# Patient Record
Sex: Male | Born: 1985
Health system: Southern US, Community
[De-identification: ages and names within clinical notes are randomized; demographics above are authoritative.]

## PROBLEM LIST (undated history)

## (undated) DIAGNOSIS — F329 Major depressive disorder, single episode, unspecified: Secondary | ICD-10-CM

## (undated) DIAGNOSIS — F32A Depression, unspecified: Secondary | ICD-10-CM

## (undated) DIAGNOSIS — F419 Anxiety disorder, unspecified: Secondary | ICD-10-CM

## (undated) DIAGNOSIS — I10 Essential (primary) hypertension: Secondary | ICD-10-CM

## (undated) HISTORY — PX: WISDOM TOOTH EXTRACTION: SHX21

---
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
## 2016-07-10 ENCOUNTER — Other Ambulatory Visit: Payer: Self-pay | Admitting: Physician Assistant

## 2016-07-10 DIAGNOSIS — N5089 Other specified disorders of the male genital organs: Secondary | ICD-10-CM

## 2016-07-16 ENCOUNTER — Other Ambulatory Visit: Payer: Self-pay

## 2016-07-18 ENCOUNTER — Ambulatory Visit
Admission: RE | Admit: 2016-07-18 | Discharge: 2016-07-18 | Disposition: A | Discharge status: Home / Self Care | Payer: Self-pay | Source: Ambulatory Visit | Attending: Physician Assistant | Admitting: Physician Assistant

## 2016-07-18 ENCOUNTER — Ambulatory Visit
Admission: RE | Admit: 2016-07-18 | Discharge: 2016-07-18 | Disposition: A | Discharge status: Home / Self Care | Payer: BLUE CROSS/BLUE SHIELD | Source: Ambulatory Visit | Attending: Physician Assistant | Admitting: Physician Assistant

## 2016-07-18 DIAGNOSIS — N5089 Other specified disorders of the male genital organs: Secondary | ICD-10-CM

## 2016-12-05 DIAGNOSIS — M545 Low back pain: Secondary | ICD-10-CM | POA: Diagnosis not present

## 2016-12-05 DIAGNOSIS — M9903 Segmental and somatic dysfunction of lumbar region: Secondary | ICD-10-CM | POA: Diagnosis not present

## 2016-12-05 DIAGNOSIS — M9904 Segmental and somatic dysfunction of sacral region: Secondary | ICD-10-CM | POA: Diagnosis not present

## 2016-12-05 DIAGNOSIS — M9902 Segmental and somatic dysfunction of thoracic region: Secondary | ICD-10-CM | POA: Diagnosis not present

## 2016-12-10 DIAGNOSIS — M545 Low back pain: Secondary | ICD-10-CM | POA: Diagnosis not present

## 2016-12-10 DIAGNOSIS — M9903 Segmental and somatic dysfunction of lumbar region: Secondary | ICD-10-CM | POA: Diagnosis not present

## 2016-12-10 DIAGNOSIS — M9904 Segmental and somatic dysfunction of sacral region: Secondary | ICD-10-CM | POA: Diagnosis not present

## 2016-12-10 DIAGNOSIS — M9902 Segmental and somatic dysfunction of thoracic region: Secondary | ICD-10-CM | POA: Diagnosis not present

## 2016-12-16 DIAGNOSIS — M545 Low back pain: Secondary | ICD-10-CM | POA: Diagnosis not present

## 2016-12-16 DIAGNOSIS — M9902 Segmental and somatic dysfunction of thoracic region: Secondary | ICD-10-CM | POA: Diagnosis not present

## 2016-12-16 DIAGNOSIS — M9904 Segmental and somatic dysfunction of sacral region: Secondary | ICD-10-CM | POA: Diagnosis not present

## 2016-12-16 DIAGNOSIS — M9903 Segmental and somatic dysfunction of lumbar region: Secondary | ICD-10-CM | POA: Diagnosis not present

## 2016-12-26 DIAGNOSIS — M545 Low back pain: Secondary | ICD-10-CM | POA: Diagnosis not present

## 2016-12-26 DIAGNOSIS — M9902 Segmental and somatic dysfunction of thoracic region: Secondary | ICD-10-CM | POA: Diagnosis not present

## 2016-12-26 DIAGNOSIS — M9904 Segmental and somatic dysfunction of sacral region: Secondary | ICD-10-CM | POA: Diagnosis not present

## 2016-12-26 DIAGNOSIS — M9903 Segmental and somatic dysfunction of lumbar region: Secondary | ICD-10-CM | POA: Diagnosis not present

## 2017-01-07 DIAGNOSIS — M9904 Segmental and somatic dysfunction of sacral region: Secondary | ICD-10-CM | POA: Diagnosis not present

## 2017-01-07 DIAGNOSIS — M9903 Segmental and somatic dysfunction of lumbar region: Secondary | ICD-10-CM | POA: Diagnosis not present

## 2017-01-07 DIAGNOSIS — M545 Low back pain: Secondary | ICD-10-CM | POA: Diagnosis not present

## 2017-01-07 DIAGNOSIS — M9902 Segmental and somatic dysfunction of thoracic region: Secondary | ICD-10-CM | POA: Diagnosis not present

## 2017-06-26 DIAGNOSIS — Z0001 Encounter for general adult medical examination with abnormal findings: Secondary | ICD-10-CM | POA: Diagnosis not present

## 2017-06-26 DIAGNOSIS — R7303 Prediabetes: Secondary | ICD-10-CM | POA: Diagnosis not present

## 2017-06-26 DIAGNOSIS — I10 Essential (primary) hypertension: Secondary | ICD-10-CM | POA: Diagnosis not present

## 2017-06-27 DIAGNOSIS — R7303 Prediabetes: Secondary | ICD-10-CM | POA: Diagnosis not present

## 2017-06-27 DIAGNOSIS — Z0001 Encounter for general adult medical examination with abnormal findings: Secondary | ICD-10-CM | POA: Diagnosis not present

## 2017-07-07 DIAGNOSIS — F4322 Adjustment disorder with anxiety: Secondary | ICD-10-CM | POA: Diagnosis not present

## 2017-08-04 DIAGNOSIS — F4322 Adjustment disorder with anxiety: Secondary | ICD-10-CM | POA: Diagnosis not present

## 2017-08-24 ENCOUNTER — Ambulatory Visit (HOSPITAL_COMMUNITY)
Admission: EM | Admit: 2017-08-24 | Discharge: 2017-08-24 | Disposition: A | Discharge status: Home / Self Care | Payer: BLUE CROSS/BLUE SHIELD | Attending: Physician Assistant | Admitting: Physician Assistant

## 2017-08-24 ENCOUNTER — Other Ambulatory Visit: Payer: Self-pay

## 2017-08-24 DIAGNOSIS — J069 Acute upper respiratory infection, unspecified: Secondary | ICD-10-CM

## 2017-08-24 DIAGNOSIS — I10 Essential (primary) hypertension: Secondary | ICD-10-CM

## 2017-08-24 DIAGNOSIS — B9789 Other viral agents as the cause of diseases classified elsewhere: Secondary | ICD-10-CM

## 2017-08-24 MED ORDER — CHLORTHALIDONE 25 MG PO TABS: 12.5000 mg | ORAL_TABLET | Freq: Every day | ORAL | 0 refills | Status: AC

## 2017-08-24 MED ORDER — HYDROCODONE-HOMATROPINE 5-1.5 MG/5ML PO SYRP: 2.5000 mL | ORAL_SOLUTION | Freq: Every day | ORAL | 0 refills | Status: AC

## 2017-08-24 NOTE — ED Provider Notes (Addendum)
08/24/2017 2:16 PM   DOB: [birth year] / MRN: [id number]  SUBJECTIVE:  Tyler Cross is a 32 y.o. male presenting for cough, sore throat, nasal congestion, low-grade fever.  Symptoms have been present now for about 5 days and he thinks is getting worse.  He is tried NyQuil with some mild relief.  Tells me he has had 3 hypertension in the past and his last physical was in February of this year.  He tells me "I need to come to terms with my weight and my blood pressure."  He has some chronic leg swelling.  He denies orthopnea.  He has No Known Allergies.   He  has no past medical history on file.    He   He  has no sexual activity history on file. The patient  has no past surgical history on file.  His family history is not on file.  ROS per HPI  OBJECTIVE:  BP (!) 160/115 (BP Location: Left Arm)   Pulse (!) 115   Temp 99.9 F (37.7 C) (Oral)   SpO2 99%   Wt Readings from Last 3 Encounters:  No data found for Wt   Temp Readings from Last 3 Encounters:  08/24/17 99.9 F (37.7 C) (Oral)   BP Readings from Last 3 Encounters:  08/24/17 (!) 160/115   Pulse Readings from Last 3 Encounters:  08/24/17 (!) 115    Physical Exam  Constitutional: He is oriented to person, place, and time.  Cardiovascular: Normal rate, regular rhythm, normal heart sounds and intact distal pulses. Exam reveals no gallop and no friction rub.  No murmur heard. 96 bpm on auscultation  Pulmonary/Chest: Effort normal and breath sounds normal. No stridor. No respiratory distress. He has no wheezes. He has no rales. He exhibits no tenderness.  Musculoskeletal: He exhibits edema (bilateral, painless).  Neurological: He is oriented to person, place, and time. He is not disoriented. He displays no atrophy, no tremor and normal reflexes. No cranial nerve deficit or sensory deficit. He exhibits normal muscle tone. He displays a negative Romberg sign. He displays no seizure activity. Coordination and gait normal. GCS  eye subscore is 4. GCS verbal subscore is 5. GCS motor subscore is 6.  Heal to toe, finger to nose, gait intact to challenge.     No results found for this or any previous visit (from the past 72 hour(s)).  No results found.  ASSESSMENT AND PLAN:   Uncontrolled hypertension: I will lower his blood pressure slowly with a medium dose of chlorthalidone.  He will follow-up with his primary care in the next couple of days.  Normal neurological exam here.  No chest pain or orthopnea.  Viral URI with cough: Cough syrup.  Given the above problem I have advised that he avoid NSAIDs.  He can take Tylenol 1000 mg every 8 hours as needed.    Discharge Instructions     take Tylenol 1000 mg every 8 hours as needed.  Please start the blood pressure medicine today and try not to miss doses.  I would like you to see your primary care provider in the next few days if possible.        The patient is advised to call or return to clinic if he does not see an improvement in symptoms, or to seek the care of the closest emergency department if he worsens with the above plan.   Deliah Boston, MHS, PA-C 08/24/2017 2:16 PM   Ofilia Neas, PA-C 08/24/17 1415  Ofilia Neas, PA-C 08/24/17 1417

## 2017-08-24 NOTE — ED Triage Notes (Signed)
Coughing, sore throat, dizziness, headaches, started Thursday night

## 2017-08-24 NOTE — Discharge Instructions (Addendum)
take Tylenol 1000 mg every 8 hours as needed.  Please start the blood pressure medicine today and try not to miss doses.  I would like you to see your primary care provider in the next few days if possible.

## 2017-09-04 DIAGNOSIS — I10 Essential (primary) hypertension: Secondary | ICD-10-CM | POA: Diagnosis not present

## 2017-09-04 DIAGNOSIS — R0683 Snoring: Secondary | ICD-10-CM | POA: Diagnosis not present

## 2017-09-04 DIAGNOSIS — R5382 Chronic fatigue, unspecified: Secondary | ICD-10-CM | POA: Diagnosis not present

## 2017-10-19 ENCOUNTER — Other Ambulatory Visit: Payer: Self-pay | Admitting: Physician Assistant

## 2017-10-29 DIAGNOSIS — F331 Major depressive disorder, recurrent, moderate: Secondary | ICD-10-CM | POA: Diagnosis not present

## 2017-11-06 DIAGNOSIS — G4719 Other hypersomnia: Secondary | ICD-10-CM | POA: Diagnosis not present

## 2017-11-18 DIAGNOSIS — F4322 Adjustment disorder with anxiety: Secondary | ICD-10-CM | POA: Diagnosis not present

## 2017-12-09 DIAGNOSIS — F4323 Adjustment disorder with mixed anxiety and depressed mood: Secondary | ICD-10-CM | POA: Diagnosis not present

## 2017-12-10 DIAGNOSIS — F320 Major depressive disorder, single episode, mild: Secondary | ICD-10-CM | POA: Diagnosis not present

## 2018-01-07 DIAGNOSIS — F4323 Adjustment disorder with mixed anxiety and depressed mood: Secondary | ICD-10-CM | POA: Diagnosis not present

## 2018-02-10 DIAGNOSIS — F4323 Adjustment disorder with mixed anxiety and depressed mood: Secondary | ICD-10-CM | POA: Diagnosis not present

## 2018-02-11 DIAGNOSIS — F3342 Major depressive disorder, recurrent, in full remission: Secondary | ICD-10-CM | POA: Diagnosis not present

## 2018-03-03 ENCOUNTER — Encounter (HOSPITAL_COMMUNITY): Payer: Self-pay | Admitting: Emergency Medicine

## 2018-03-03 ENCOUNTER — Ambulatory Visit (HOSPITAL_COMMUNITY)
Admission: EM | Admit: 2018-03-03 | Discharge: 2018-03-03 | Disposition: A | Discharge status: Home / Self Care | Payer: BLUE CROSS/BLUE SHIELD | Attending: Family Medicine | Admitting: Family Medicine

## 2018-03-03 ENCOUNTER — Other Ambulatory Visit: Payer: Self-pay

## 2018-03-03 ENCOUNTER — Ambulatory Visit (INDEPENDENT_AMBULATORY_CARE_PROVIDER_SITE_OTHER): Payer: BLUE CROSS/BLUE SHIELD

## 2018-03-03 DIAGNOSIS — S93422A Sprain of deltoid ligament of left ankle, initial encounter: Secondary | ICD-10-CM

## 2018-03-03 DIAGNOSIS — S99912A Unspecified injury of left ankle, initial encounter: Secondary | ICD-10-CM | POA: Diagnosis not present

## 2018-03-03 DIAGNOSIS — M7989 Other specified soft tissue disorders: Secondary | ICD-10-CM | POA: Diagnosis not present

## 2018-03-03 DIAGNOSIS — M79672 Pain in left foot: Secondary | ICD-10-CM

## 2018-03-03 DIAGNOSIS — S99922A Unspecified injury of left foot, initial encounter: Secondary | ICD-10-CM | POA: Diagnosis not present

## 2018-03-03 DIAGNOSIS — M25572 Pain in left ankle and joints of left foot: Secondary | ICD-10-CM | POA: Diagnosis not present

## 2018-03-03 MED ORDER — NAPROXEN 500 MG PO TABS: 500.0000 mg | ORAL_TABLET | Freq: Two times a day (BID) | ORAL | 0 refills | Status: DC

## 2018-03-03 NOTE — Discharge Instructions (Signed)
Continue conservative management of rest, ice, and elevate Take naproxen as needed for pain relief (may cause abdominal discomfort, ulcers, and GI bleeds avoid taking with other NSAIDs) Follow up with orthopedist if symptoms persist Return or go to the ER if you have any new or worsening symptoms (fever, chills, chest pain, abdominal pain, changes in bowel or bladder habits, pain radiating into lower legs, etc...)

## 2018-03-03 NOTE — ED Provider Notes (Signed)
[facility]   [id number] 03/03/18 Arrival Time: 0809  CC: Left ankle and foot pain  SUBJECTIVE: History from: patient. Tyler Cross is a 32 y.o. male complains of left ankle and foot pain that began 2 days ago.  Began after he rolled his ankle during praise and worship at his church.  Localizes the pain to the inside of ankle and top of foot.  Describes the pain as intermittent.  Has tried ice, elevation, and OTC analgesics with temporary relief.  Symptoms are made worse with weight-bearing and walking.  Reports similar symptoms in the past.  Complains of associated swelling.  Denies fever, chills, erythema, ecchymosis, weakness, numbness and tingling.      ROS: As per HPI.  History reviewed. No pertinent past medical history. History reviewed. No pertinent surgical history. No Known Allergies No current facility-administered medications on file prior to encounter.    Current Outpatient Medications on File Prior to Encounter  Medication Sig Dispense Refill  . buPROPion (WELLBUTRIN) 75 MG tablet Take 75 mg by mouth 2 (two) times daily.    . chlorthalidone (HYGROTON) 25 MG tablet Take 0.5 tablets (12.5 mg total) by mouth daily. 30 tablet 0  . UNKNOWN TO PATIENT      Social History   Socioeconomic History  . Marital status: Married    Spouse name: Not on file  . Number of children: Not on file  . Years of education: Not on file  . Highest education level: Not on file  Occupational History  . Not on file  Social Needs  . Financial resource strain: Not on file  . Food insecurity:    Worry: Not on file    Inability: Not on file  . Transportation needs:    Medical: Not on file    Non-medical: Not on file  Tobacco Use  . Smoking status: Not on file  Substance and Sexual Activity  . Alcohol use: Not on file  . Drug use: Not on file  . Sexual activity: Not on file  Lifestyle  . Physical activity:    Days per week: Not on file    Minutes per session: Not on file    . Stress: Not on file  Relationships  . Social connections:    Talks on phone: Not on file    Gets together: Not on file    Attends religious service: Not on file    Active member of club or organization: Not on file    Attends meetings of clubs or organizations: Not on file    Relationship status: Not on file  . Intimate partner violence:    Fear of current or ex partner: Not on file    Emotionally abused: Not on file    Physically abused: Not on file    Forced sexual activity: Not on file  Other Topics Concern  . Not on file  Social History Narrative  . Not on file   No family history on file.  OBJECTIVE:  Vitals:   03/03/18 0847  BP: 139/80  Pulse: 88  Resp: 18  Temp: 98.6 F (37 C)  TempSrc: Oral  SpO2: 98%    General appearance: AOx3; in no acute distress.  Head: NCAT Lungs: Normal respiratory effort Heart: Left dorsalis pedis pulse 2+ Musculoskeletal: Left ankle/foot  Inspection: Skin warm, dry, clear and intact without obvious erythema, or ecchymosis. Swelling of the dorsal aspect of proximal foot and medial and lateral ankle Palpation: Tender to palpation over medial aspect of  ankle, lateral malleolus and proximal metatarsals  ROM: FROM active and passive with mild discomfort with inversion and eversion Strength: 5/5 dorsiflexion, 5/5 plantar flexion Skin: warm and dry Neurologic: Antalgic gait; Sensation intact about the lower extremities Psychological: alert and cooperative; normal mood and affect  DIAGNOSTIC STUDIES:  Dg Ankle Complete Left  Result Date: 03/03/2018 CLINICAL DATA:  Internal inversion injury of the foot 2 days ago with persistent pain and swelling over the medial malleolus and proximal metatarsals. EXAM: LEFT ANKLE COMPLETE - 3+ VIEW COMPARISON:  Left foot series of today's date FINDINGS: The bones are subjectively adequately mineralized. There is no acute malleolar fracture. The talar dome is intact and the ankle joint mortise is  preserved. The talus and calcaneus exhibit no acute abnormalities. There is an Achilles region calcaneal spur. IMPRESSION: There is no acute or significant chronic bony abnormality of the left ankle. There is diffuse soft tissue swelling. Electronically Signed   By: David  [place] M.D.   On: 03/03/2018 09:23   Dg Foot Complete Left  Result Date: 03/03/2018 CLINICAL DATA:  Patient reports rolling injury internally of the foot 2 days ago with persistent pain and swelling over the medial malleolar region and proximal metatarsals especially when bearing weight. EXAM: LEFT FOOT - COMPLETE 3+ VIEW COMPARISON:  Left ankle series of today's date FINDINGS: There is moderate soft tissue swelling over the midfoot and hindfoot. The phalanges and metatarsals are intact. The tarsal bones exhibit no acute abnormalities. The joint spaces are well maintained. There is an Achilles region calcaneal spur. IMPRESSION: No acute fracture nor dislocation nor significant arthropathic change is observed. There is diffuse soft tissue swelling over the hindfoot and midfoot. Electronically Signed   By: David  [place] M.D.   On: 03/03/2018 09:21    ASSESSMENT & PLAN:  1. Sprain of deltoid ligament of left ankle, initial encounter   2. Left foot pain     Meds ordered this encounter  Medications  . naproxen (NAPROSYN) 500 MG tablet    Sig: Take 1 tablet (500 mg total) by mouth 2 (two) times daily.    Dispense:  30 tablet    Refill:  0    Order Specific Question:   Supervising Provider    Answer:   Eustace Moore [[id number]]   Orders Placed This Encounter  Procedures  . DG Foot Complete Left    Standing Status:   Standing    Number of Occurrences:   1    Order Specific Question:   Reason for Exam (SYMPTOM  OR DIAGNOSIS REQUIRED)    Answer:   foot injury  . DG Ankle Complete Left    Standing Status:   Standing    Number of Occurrences:   1    Order Specific Question:   Reason for Exam (SYMPTOM  OR DIAGNOSIS REQUIRED)      Answer:   foot injury  . Apply cam walker    Standing Status:   Standing    Number of Occurrences:   1    Order Specific Question:   Laterality    Answer:   Left    Cam walker placed Continue conservative management of rest, ice, and elevate Take naproxen as needed for pain relief (may cause abdominal discomfort, ulcers, and GI bleeds avoid taking with other NSAIDs) Follow up with orthopedist if symptoms persist Return or go to the ER if you have any new or worsening symptoms (fever, chills, chest pain, abdominal pain, changes in bowel or bladder  habits, pain radiating into lower legs, etc...)   Reviewed expectations re: course of current medical issues. Questions answered. Outlined signs and symptoms indicating need for more acute intervention. Patient verbalized understanding. After Visit Summary given.    Rennis HardingWurst, [place]onea Gaut, PA-C 03/03/18 1036

## 2018-03-03 NOTE — ED Triage Notes (Signed)
PT believes he injured left ankle Sunday. Area is swollen. PT is able to bear weight, but not full weight

## 2018-05-04 ENCOUNTER — Ambulatory Visit (INDEPENDENT_AMBULATORY_CARE_PROVIDER_SITE_OTHER): Payer: BLUE CROSS/BLUE SHIELD

## 2018-05-04 ENCOUNTER — Other Ambulatory Visit: Payer: Self-pay | Admitting: Podiatrist

## 2018-05-04 ENCOUNTER — Ambulatory Visit (INDEPENDENT_AMBULATORY_CARE_PROVIDER_SITE_OTHER): Payer: BLUE CROSS/BLUE SHIELD | Admitting: Podiatrist

## 2018-05-04 VITALS — BP 137/86 | HR 87

## 2018-05-04 DIAGNOSIS — M79671 Pain in right foot: Secondary | ICD-10-CM | POA: Diagnosis not present

## 2018-05-04 DIAGNOSIS — Q666 Other congenital valgus deformities of feet: Secondary | ICD-10-CM

## 2018-05-04 DIAGNOSIS — M7751 Other enthesopathy of right foot: Secondary | ICD-10-CM | POA: Diagnosis not present

## 2018-05-04 DIAGNOSIS — M7750 Other enthesopathy of unspecified foot: Secondary | ICD-10-CM

## 2018-05-04 DIAGNOSIS — M79672 Pain in left foot: Secondary | ICD-10-CM | POA: Diagnosis not present

## 2018-05-04 DIAGNOSIS — M7752 Other enthesopathy of left foot: Secondary | ICD-10-CM

## 2018-05-04 NOTE — Patient Instructions (Signed)
Orthotics are inserts for your shoes. They will help the way you walk and support your foot structure and alignment.  We take a scan of your foot that the orthotic is specially made to fit.  The insert is prescription just for you.  We will call when the inserts are ready to be dispensed-- it usually takes about 2-3 weeks.  Please bring in the sneakers you wish to wear the orthotics in so we can make sure they fit properly.  Please call if you have not heard from [place].

## 2018-05-05 ENCOUNTER — Encounter: Payer: Self-pay | Admitting: Podiatrist

## 2018-05-05 ENCOUNTER — Other Ambulatory Visit: Payer: Self-pay | Admitting: Podiatrist

## 2018-05-05 ENCOUNTER — Telehealth: Payer: Self-pay | Admitting: Podiatrist

## 2018-05-05 DIAGNOSIS — Q666 Other congenital valgus deformities of feet: Secondary | ICD-10-CM

## 2018-05-05 DIAGNOSIS — M7750 Other enthesopathy of unspecified foot: Secondary | ICD-10-CM

## 2018-05-05 MED ORDER — IBUPROFEN 600 MG PO TABS: 600.0000 mg | ORAL_TABLET | Freq: Three times a day (TID) | ORAL | 2 refills | Status: DC | PRN

## 2018-05-05 MED ORDER — DICLOFENAC SODIUM 75 MG PO TBEC: 75.0000 mg | DELAYED_RELEASE_TABLET | Freq: Two times a day (BID) | ORAL | 1 refills | Status: DC

## 2018-05-05 NOTE — Telephone Encounter (Signed)
Could you send in an order for Ibuprofen 600- take 3 times a day #30 with 2 refills.  I tried to do it but  my provider info isn't set to let me e-prescribe.   Thanks!

## 2018-05-05 NOTE — Addendum Note (Signed)
Addended by: Alphia Kava D on: 05/05/2018 05:07 PM   Modules accepted: Orders

## 2018-05-05 NOTE — Progress Notes (Signed)
  Subjective:     Patient ID: Surafel Bulkley, male   DOB: 13-Feb-1986, 33 y.o.   MRN: [id number]  HPI pain in bilateral feet upon standing   Review of Systems  All other systems reviewed and are negative.      Objective:   Physical Exam GENERAL APPEARANCE: Alert, conversant. Appropriately groomed. No acute distress.  VASCULAR: Pedal pulses palpable at 2/4 DP and PT bilateral.  Capillary refill time is immediate to all digits,  Proximal to distal cooling it warm to warm.  Digital hair growth is present bilateral  Generalized swelling present bilateral feet and lower legs NEUROLOGIC: sensation is intact epicritically and protectively to 5.07 monofilament at 5/5 sites bilateral.  Light touch is intact bilateral, vibratory sensation intact bilateral, achilles tendon reflex is intact bilateral.  MUSCULOSKELETAL: pes plano valgus appearance noted bilaterally.  Discomfort upon compression along the medial arch noted.  General pain on the plantar aspect of bilateral feet noted.  Upon standing - arch flattens and eversion noted of bilateral feet.  Decrease in dorsiflexion at ankle noted bilateral by 10 degrees.   DERMATOLOGIC: skin color, texture, and turger are within normal limits.   Xrays- pes planus foot type is seen.  Lateral view shows sagging at the talonavicular joint, decreased calcaneal inclination angle.  dp view shows some forefoot abduction and uncovering of the Talus at the navicular     Assessment:     Congenital pesplanovalgus with pain and swelling    Plan:    patient is casted for new orthotics.  rx for ibuprofen will also be sent in to his pharmacy, discussed wearing compression stockings for relief of his swelling and to discuss options with his primary care physician regarding the swelling.

## 2018-05-05 NOTE — Telephone Encounter (Signed)
I was there yesterday and would like a call back when you get this message.

## 2018-05-05 NOTE — Telephone Encounter (Addendum)
I called pt and he states he needs some pain medication until he gets his orthotics, not naproxen it doesn't help, but something like ibuprofen or acetaminophen. I told pt I would send the message to Dr. Irving Shows, and often she looks at her messages on out of offices day and may respond today if not then tomorrow. I instructed pt to perform ice therapy 3-4 times daily for 15-20 minutes/session protecting the skin from the ice with a light cloth. Pt states understanding.

## 2018-05-06 ENCOUNTER — Telehealth: Payer: Self-pay | Admitting: *Deleted

## 2018-05-06 NOTE — Telephone Encounter (Signed)
-----   Message from Delories Heinz, DPM sent at 05/05/2018  5:03 PM EST ----- Regarding: I figured it out!!!  you don't need to do rx. I figured out how to do the prescription so it e-prescribes under my provider info.. in the process I also decided to do diclofenac instead of ibuprofen so you don't need to do a rx for him.  THANKS!!  Dr. Bea Laura

## 2018-05-22 ENCOUNTER — Ambulatory Visit: Payer: BLUE CROSS/BLUE SHIELD | Admitting: Orthotics

## 2018-05-22 DIAGNOSIS — Q666 Other congenital valgus deformities of feet: Secondary | ICD-10-CM

## 2018-05-22 DIAGNOSIS — M7750 Other enthesopathy of unspecified foot: Secondary | ICD-10-CM

## 2018-05-22 NOTE — Progress Notes (Signed)
Patient came in today to pick up custom made foot orthotics.  The goals were accomplished and the patient reported no dissatisfaction with said orthotics.  Patient was advised of breakin period and how to report any issues. 

## 2018-06-22 ENCOUNTER — Other Ambulatory Visit: Payer: Self-pay | Admitting: Podiatry

## 2018-07-16 DIAGNOSIS — J3089 Other allergic rhinitis: Secondary | ICD-10-CM | POA: Diagnosis not present

## 2018-07-16 DIAGNOSIS — I10 Essential (primary) hypertension: Secondary | ICD-10-CM | POA: Diagnosis not present

## 2018-07-16 DIAGNOSIS — F3341 Major depressive disorder, recurrent, in partial remission: Secondary | ICD-10-CM | POA: Diagnosis not present

## 2018-08-10 ENCOUNTER — Encounter (HOSPITAL_COMMUNITY): Payer: Self-pay | Admitting: *Deleted

## 2018-08-10 ENCOUNTER — Other Ambulatory Visit: Payer: Self-pay

## 2018-08-10 ENCOUNTER — Ambulatory Visit (HOSPITAL_COMMUNITY)
Admission: EM | Admit: 2018-08-10 | Discharge: 2018-08-10 | Disposition: A | Discharge status: Home / Self Care | Payer: BLUE CROSS/BLUE SHIELD | Attending: Family Medicine | Admitting: Family Medicine

## 2018-08-10 DIAGNOSIS — M25562 Pain in left knee: Secondary | ICD-10-CM | POA: Diagnosis not present

## 2018-08-10 HISTORY — DX: Anxiety disorder, unspecified: F41.9

## 2018-08-10 HISTORY — DX: Essential (primary) hypertension: I10

## 2018-08-10 HISTORY — DX: Depression, unspecified: F32.A

## 2018-08-10 MED ORDER — DICLOFENAC SODIUM 75 MG PO TBEC: 75.0000 mg | DELAYED_RELEASE_TABLET | Freq: Two times a day (BID) | ORAL | 0 refills | Status: DC

## 2018-08-10 NOTE — ED Triage Notes (Signed)
Reports "twisting" left knee few days ago while playing with dog.  C/O difficulty ambulating due to pain and sensation of feeling loose.  Walking with cane; wearing knee brace.

## 2018-08-10 NOTE — ED Provider Notes (Signed)
[facility]   [id number] 08/10/18 Arrival Time: [id number]  ASSESSMENT & PLAN:  1. Acute pain of left knee    No indications for imaging at this time. Discussed.  Meds ordered this encounter  Medications  . diclofenac (VOLTAREN) 75 MG EC tablet    Sig: Take 1 tablet (75 mg total) by mouth 2 (two) times daily.    Dispense:  14 tablet    Refill:  0   Orders Placed This Encounter  Procedures  . Apply knee sleeve   WBAT. Ice to knee.  Follow-up Information    [facility], [facility]   Specialty:  Specialist Why:  Call to schedule follow up if your knee pain is not improving over the next week. Contact information: [address] [place] [id number] [phone]          Reviewed expectations re: course of current medical issues. Questions answered. Outlined signs and symptoms indicating need for more acute intervention. Patient verbalized understanding. After Visit Summary given.  SUBJECTIVE: History from: patient. Geralyn CorwinJoseph Montemurro is a 33 y.o. male who reports fairly persitsent moderate pain of his left lateral knee; described as aching without radiation. Onset: gradual, 3 days ago. Injury/trama: reports "that I twisted my knee while playing with my dog"; able to finish the walk noticed increasing discomfort a few hours later. Symptoms have been mostly unchanged since beginning. Aggravating factors: weight bearing. Alleviating factors: rest. Associated symptoms: none reported. Extremity sensation changes or weakness: none. Self treatment: occasional ibuprofen with mild help History of similar: no.  Past Surgical History:  Procedure Laterality Date  . WISDOM TOOTH EXTRACTION      ROS: As per HPI. All other systems negative.    OBJECTIVE:  Vitals:   08/10/18 0905  BP: 135/84  Pulse: 84  Resp: 16  Temp: 99.1 F (37.3 C)  TempSrc: Oral  SpO2: 100%    General appearance: alert; no distress HEENT: Bruce; AT Neck: supple with FROM Extremities:  . LLE: warm and well perfused; fairly well localized moderate tenderness over left LCL; without gross deformities; with mild swelling; with no bruising; ROM: normal without reported lateral discomfort; without frank instability CV: brisk extremity capillary refill of LLE; 2+ DP and PT pulse of LLE. Skin: warm and dry; no visible rashes Neurologic: normal reflexes of LLE; normal sensation of LLE; normal strength of LLE Psychological: alert and cooperative; normal mood and affect  Allergies  Allergen Reactions  . Kiwi Extract     Past Medical History:  Diagnosis Date  . Anxiety   . Depression   . Hypertension    Social History   Socioeconomic History  . Marital status: Married    Spouse name: Not on file  . Number of children: Not on file  . Years of education: Not on file  . Highest education level: Not on file  Occupational History  . Not on file  Social Needs  . Financial resource strain: Not on file  . Food insecurity:    Worry: Not on file    Inability: Not on file  . Transportation needs:    Medical: Not on file    Non-medical: Not on file  Tobacco Use  . Smoking status: Never Smoker  . Smokeless tobacco: Never Used  Substance and Sexual Activity  . Alcohol use: Yes    Comment: occasionally  . Drug use: Yes    Types: Marijuana  . Sexual activity: Not on file  Lifestyle  . Physical activity:  Days per week: Not on file    Minutes per session: Not on file  . Stress: Not on file  Relationships  . Social connections:    Talks on phone: Not on file    Gets together: Not on file    Attends religious service: Not on file    Active member of club or organization: Not on file    Attends meetings of clubs or organizations: Not on file    Relationship status: Not on file  Other Topics Concern  . Not on file  Social History Narrative  . Not on file   Family History  Problem Relation Age of Onset  . Stroke Mother   . Fibromyalgia Mother   . Healthy Father     Past Surgical History:  Procedure Laterality Date  . WISDOM TOOTH EXTRACTION        Mardella Layman, MD 08/10/18 [id number]

## 2018-10-13 DIAGNOSIS — Z0000 Encounter for general adult medical examination without abnormal findings: Secondary | ICD-10-CM | POA: Diagnosis not present

## 2018-10-13 DIAGNOSIS — Z13220 Encounter for screening for lipoid disorders: Secondary | ICD-10-CM | POA: Diagnosis not present

## 2018-10-13 DIAGNOSIS — R7303 Prediabetes: Secondary | ICD-10-CM | POA: Diagnosis not present

## 2018-10-13 DIAGNOSIS — Z8249 Family history of ischemic heart disease and other diseases of the circulatory system: Secondary | ICD-10-CM | POA: Diagnosis not present

## 2018-10-13 DIAGNOSIS — F3341 Major depressive disorder, recurrent, in partial remission: Secondary | ICD-10-CM | POA: Diagnosis not present

## 2018-10-13 DIAGNOSIS — Z6841 Body Mass Index (BMI) 40.0 and over, adult: Secondary | ICD-10-CM | POA: Diagnosis not present

## 2018-10-16 DIAGNOSIS — F4323 Adjustment disorder with mixed anxiety and depressed mood: Secondary | ICD-10-CM | POA: Diagnosis not present

## 2018-10-17 DIAGNOSIS — F331 Major depressive disorder, recurrent, moderate: Secondary | ICD-10-CM | POA: Diagnosis not present

## 2018-10-26 DIAGNOSIS — F4322 Adjustment disorder with anxiety: Secondary | ICD-10-CM | POA: Diagnosis not present

## 2018-10-28 ENCOUNTER — Emergency Department (HOSPITAL_COMMUNITY): Payer: BC Managed Care – PPO

## 2018-10-28 ENCOUNTER — Encounter (HOSPITAL_COMMUNITY): Payer: Self-pay | Admitting: Emergency Medicine

## 2018-10-28 ENCOUNTER — Other Ambulatory Visit: Payer: Self-pay

## 2018-10-28 ENCOUNTER — Emergency Department (HOSPITAL_COMMUNITY)
Admission: EM | Admit: 2018-10-28 | Discharge: 2018-10-28 | Disposition: A | Discharge status: Home / Self Care | Payer: BC Managed Care – PPO | Attending: Emergency Medicine | Admitting: Emergency Medicine

## 2018-10-28 DIAGNOSIS — I10 Essential (primary) hypertension: Secondary | ICD-10-CM | POA: Diagnosis not present

## 2018-10-28 DIAGNOSIS — J069 Acute upper respiratory infection, unspecified: Secondary | ICD-10-CM | POA: Insufficient documentation

## 2018-10-28 DIAGNOSIS — Z20828 Contact with and (suspected) exposure to other viral communicable diseases: Secondary | ICD-10-CM | POA: Insufficient documentation

## 2018-10-28 DIAGNOSIS — Z79899 Other long term (current) drug therapy: Secondary | ICD-10-CM | POA: Insufficient documentation

## 2018-10-28 DIAGNOSIS — B9789 Other viral agents as the cause of diseases classified elsewhere: Secondary | ICD-10-CM | POA: Diagnosis not present

## 2018-10-28 DIAGNOSIS — R05 Cough: Secondary | ICD-10-CM | POA: Diagnosis not present

## 2018-10-28 LAB — SARS CORONAVIRUS 2 BY RT PCR (HOSPITAL ORDER, PERFORMED IN ~~LOC~~ HOSPITAL LAB): SARS Coronavirus 2: NEGATIVE

## 2018-10-28 NOTE — Discharge Instructions (Signed)
It was our pleasure to provide your ER care today - we hope that you feel better.  Take mucinex or robitussin as need for cough.   Your chest xray looks good/clear and your covid test is negative.   Follow up with primary care doctor in 1 week if symptoms fail to improve/resolve.  Return to ER if worse, new symptoms, fevers, increased trouble breathing, new or severe pain, chest pain, other concern.

## 2018-10-28 NOTE — ED Provider Notes (Signed)
[facility]-EMERGENCY DEPT Provider Note   CSN: [id number] Arrival date & time: 10/28/18  1250     History   Chief Complaint Chief Complaint  Patient presents with  . Cough    HPI Tyler Cross is a 33 y.o. male.     Patient c/o non prod cough, mild sob w coughing spell, mild body aches, for the past week. Symptoms acute onset, moderate, persistent, felt worse today.  No chest pain. No abd pain or nvd. No dysuria or gu c/o. No rash. No known covid + exposure. Denies headache. No neck pain or stiffness. No sore throat. Non smoker, no hx chronic lung disease.   The history is provided by the patient.  Cough Associated symptoms: no chest pain, no fever, no headaches, no rash, no shortness of breath and no sore throat     Past Medical History:  Diagnosis Date  . Anxiety   . Depression   . Hypertension     There are no active problems to display for this patient.   Past Surgical History:  Procedure Laterality Date  . WISDOM TOOTH EXTRACTION          Home Medications    Prior to Admission medications   Medication Sig Start Date End Date Taking? Authorizing Provider  buPROPion (WELLBUTRIN XL) 150 MG 24 hr tablet  04/24/18   [provider]  chlorthalidone (HYGROTON) 25 MG tablet Take 0.5 tablets (12.5 mg total) by mouth daily. 08/24/17   Ofilia Neaslark, Michael L, PA-C  diclofenac (VOLTAREN) 75 MG EC tablet Take 1 tablet (75 mg total) by mouth 2 (two) times daily. 08/10/18   Mardella LaymanHagler, Brian, MD  UNKNOWN TO PATIENT     [provider]  VIIBRYD 40 MG TABS  04/24/18   [provider]    Family History Family History  Problem Relation Age of Onset  . Stroke Mother   . Fibromyalgia Mother   . Healthy Father     Social History Social History   Tobacco Use  . Smoking status: Never Smoker  . Smokeless tobacco: Never Used  Substance Use Topics  . Alcohol use: Yes    Comment: occasionally  . Drug use: Yes    Types: Marijuana      Allergies   Kiwi extract   Review of Systems Review of Systems  Constitutional: Negative for fever.  HENT: Negative for sore throat.   Eyes: Negative for redness.  Respiratory: Positive for cough. Negative for shortness of breath.   Cardiovascular: Negative for chest pain.  Gastrointestinal: Negative for abdominal pain, diarrhea and vomiting.  Endocrine: Negative for polyuria.  Genitourinary: Negative for dysuria and flank pain.  Musculoskeletal: Negative for back pain and neck pain.  Skin: Negative for rash.  Neurological: Negative for headaches.  Hematological: Does not bruise/bleed easily.  Psychiatric/Behavioral: Negative for confusion.     Physical Exam Updated Vital Signs BP (!) 144/73 (BP Location: Left Arm)   Pulse 92   Temp 99.5 F (37.5 C) (Oral)   Resp 18   SpO2 98%   Physical Exam Vitals signs and nursing note reviewed.  Constitutional:      Appearance: Normal appearance. He is well-developed.  HENT:     Head: Atraumatic.     Nose: Nose normal.     Mouth/Throat:     Mouth: Mucous membranes are moist.     Pharynx: Oropharynx is clear. No oropharyngeal exudate or posterior oropharyngeal erythema.  Eyes:     General: No scleral icterus.  Conjunctiva/sclera: Conjunctivae normal.  Neck:     Musculoskeletal: Normal range of motion and neck supple. No neck rigidity.     Trachea: No tracheal deviation.     Comments: No stiffness or rigidity.  Cardiovascular:     Rate and Rhythm: Normal rate and regular rhythm.     Pulses: Normal pulses.     Heart sounds: Normal heart sounds. No murmur. No friction rub. No gallop.   Pulmonary:     Effort: Pulmonary effort is normal. No accessory muscle usage or respiratory distress.     Breath sounds: Normal breath sounds.  Abdominal:     General: Bowel sounds are normal. There is no distension.     Palpations: Abdomen is soft. There is no mass.     Tenderness: There is no abdominal tenderness. There is no  guarding or rebound.     Hernia: No hernia is present.     Comments: No hsm.   Genitourinary:    Comments: No cva tenderness. Musculoskeletal:        General: No swelling or tenderness.     Right lower leg: No edema.     Left lower leg: No edema.  Skin:    General: Skin is warm and dry.     Findings: No rash.  Neurological:     Mental Status: He is alert.     Comments: Alert, speech clear. Steady gait.   Psychiatric:        Mood and Affect: Mood normal.      ED Treatments / Results  Labs (all labs ordered are listed, but only abnormal results are displayed) Results for orders placed or performed during the hospital encounter of 10/28/18  SARS Coronavirus 2 (CEPHEID- Performed in [place] hospital lab), [facility] Order   Specimen: Nasopharyngeal Swab  Result Value Ref Range   SARS Coronavirus 2 NEGATIVE NEGATIVE   Dg Chest Port 1 View  Result Date: 10/28/2018 CLINICAL DATA:  Productive cough for several weeks. EXAM: PORTABLE CHEST 1 VIEW COMPARISON:  None. FINDINGS: Lungs clear. Heart size normal. No pneumothorax or pleural effusion. No acute or focal bony abnormality. IMPRESSION: Negative chest. Electronically Signed   By: Inge Rise M.D.   On: 10/28/2018 15:20    EKG None  Radiology Dg Chest Port 1 View  Result Date: 10/28/2018 CLINICAL DATA:  Productive cough for several weeks. EXAM: PORTABLE CHEST 1 VIEW COMPARISON:  None. FINDINGS: Lungs clear. Heart size normal. No pneumothorax or pleural effusion. No acute or focal bony abnormality. IMPRESSION: Negative chest. Electronically Signed   By: Inge Rise M.D.   On: 10/28/2018 15:20    Procedures Procedures (including critical care time)  Medications Ordered in ED Medications - No data to display   Initial Impression / Assessment and Plan / ED Course  I have reviewed the triage vital signs and the nursing notes.  Pertinent labs & imaging results that were available during my care of the patient were  reviewed by me and considered in my medical decision making (see chart for details).  Labs. Cxr.   CXR reviewed by me - no pna.   Reviewed nursing notes and prior charts for additional history.   Labs reviewed by me - covid neg.   Discussed results w pt.   Pt breathing comfortably, pulse ox 98% - pt appears stable for d/c.  Return precautions provided.   Tyler Cross was evaluated in Emergency Department on 10/28/2018 for the symptoms described in the history of present illness. He  was evaluated in the context of the global COVID-19 pandemic, which necessitated consideration that the patient might be at risk for infection with the SARS-CoV-2 virus that causes COVID-19. Institutional protocols and algorithms that pertain to the evaluation of patients at risk for COVID-19 are in a state of rapid change based on information released by regulatory bodies including the CDC and federal and state organizations. These policies and algorithms were followed during the patient's care in the ED.   Final Clinical Impressions(s) / ED Diagnoses   Final diagnoses:  None    ED Discharge Orders    None       Cathren LaineSteinl, Daltyn Degroat, MD 10/28/18 1625

## 2018-10-28 NOTE — ED Triage Notes (Signed)
Pt c/o cough that has been persistent for several weeks even after his URI symptoms have subsided. Reports beige white phlegm when coughs.

## 2018-11-10 DIAGNOSIS — F3341 Major depressive disorder, recurrent, in partial remission: Secondary | ICD-10-CM | POA: Diagnosis not present

## 2018-11-10 DIAGNOSIS — I10 Essential (primary) hypertension: Secondary | ICD-10-CM | POA: Diagnosis not present

## 2018-11-20 ENCOUNTER — Emergency Department (HOSPITAL_COMMUNITY)
Admission: EM | Admit: 2018-11-20 | Discharge: 2018-11-20 | Disposition: A | Discharge status: Home / Self Care | Payer: BC Managed Care – PPO | Attending: Emergency Medicine | Admitting: Emergency Medicine

## 2018-11-20 ENCOUNTER — Emergency Department (HOSPITAL_COMMUNITY): Payer: BC Managed Care – PPO

## 2018-11-20 ENCOUNTER — Encounter (HOSPITAL_COMMUNITY): Payer: Self-pay | Admitting: *Deleted

## 2018-11-20 ENCOUNTER — Other Ambulatory Visit: Payer: Self-pay

## 2018-11-20 DIAGNOSIS — Y92017 Garden or yard in single-family (private) house as the place of occurrence of the external cause: Secondary | ICD-10-CM | POA: Diagnosis not present

## 2018-11-20 DIAGNOSIS — Z79899 Other long term (current) drug therapy: Secondary | ICD-10-CM | POA: Diagnosis not present

## 2018-11-20 DIAGNOSIS — R600 Localized edema: Secondary | ICD-10-CM | POA: Diagnosis not present

## 2018-11-20 DIAGNOSIS — Y93H2 Activity, gardening and landscaping: Secondary | ICD-10-CM | POA: Diagnosis not present

## 2018-11-20 DIAGNOSIS — Y998 Other external cause status: Secondary | ICD-10-CM | POA: Diagnosis not present

## 2018-11-20 DIAGNOSIS — I10 Essential (primary) hypertension: Secondary | ICD-10-CM | POA: Insufficient documentation

## 2018-11-20 DIAGNOSIS — S99912A Unspecified injury of left ankle, initial encounter: Secondary | ICD-10-CM | POA: Diagnosis not present

## 2018-11-20 DIAGNOSIS — F1210 Cannabis abuse, uncomplicated: Secondary | ICD-10-CM | POA: Diagnosis not present

## 2018-11-20 DIAGNOSIS — X509XXA Other and unspecified overexertion or strenuous movements or postures, initial encounter: Secondary | ICD-10-CM | POA: Diagnosis not present

## 2018-11-20 DIAGNOSIS — S93402A Sprain of unspecified ligament of left ankle, initial encounter: Secondary | ICD-10-CM | POA: Insufficient documentation

## 2018-11-20 MED ORDER — NAPROXEN 500 MG PO TABS: 500.0000 mg | ORAL_TABLET | Freq: Two times a day (BID) | ORAL | 0 refills | Status: AC

## 2018-11-20 NOTE — ED Triage Notes (Signed)
Left ankle pain after cutting grass and he "twisted" his ankle (yesterday). Swelling to the LLE, cms intact. Last ibuprofen around [place].

## 2018-11-20 NOTE — Discharge Instructions (Signed)
Your x-ray today was normal.  I prescribed medication to help with your pain and swelling.  Please take these as directed.  I have also provided a referral for orthopedics, please schedule an appointment for further eval of your recurrent ankle sprains.

## 2018-11-20 NOTE — ED Provider Notes (Signed)
[facility] EMERGENCY DEPARTMENT Provider Note   CSN: [id number] Arrival date & time: 11/20/18  0126     History   Chief Complaint Chief Complaint  Patient presents with  . Ankle Pain    HPI Tyler Cross is a 33 y.o. male.     33 y.o male with a PMH HTN, Anxiety presents to the ED with a chief complaint of left ankle pain times yesterday.  Patient reports he was mowing the lawn when he felt his foot everted, since then he is reported swelling along with pain.  He reports pain is worse with ambulation.  He has taken some Advil to help with his symptoms without improvement.  Patient is currently walking with a cane as he reports pain with weightbearing.  He reports his ongoing issue for him, has twisted his right foot in the past.  He is currently followed by podiatry.  Denies any fevers, weakness, other complaints.   The history is provided by the patient.    Past Medical History:  Diagnosis Date  . Anxiety   . Depression   . Hypertension     There are no active problems to display for this patient.   Past Surgical History:  Procedure Laterality Date  . WISDOM TOOTH EXTRACTION          Home Medications    Prior to Admission medications   Medication Sig Start Date End Date Taking? Authorizing Provider  buPROPion (WELLBUTRIN XL) 150 MG 24 hr tablet  04/24/18   [provider]  chlorthalidone (HYGROTON) 25 MG tablet Take 0.5 tablets (12.5 mg total) by mouth daily. 08/24/17   Tereasa Coop, PA-C  diclofenac (VOLTAREN) 75 MG EC tablet Take 1 tablet (75 mg total) by mouth 2 (two) times daily. 08/10/18   Vanessa Kick, MD  naproxen (NAPROSYN) 500 MG tablet Take 1 tablet (500 mg total) by mouth 2 (two) times daily for 7 days. 11/20/18 11/27/18  Janeece Fitting, PA-C  UNKNOWN TO PATIENT     [provider]  VIIBRYD 40 MG TABS  04/24/18   [provider]    Family History Family History  Problem Relation Age of Onset  . Stroke  Mother   . Fibromyalgia Mother   . Healthy Father     Social History Social History   Tobacco Use  . Smoking status: Never Smoker  . Smokeless tobacco: Never Used  Substance Use Topics  . Alcohol use: Yes    Comment: occasionally  . Drug use: Yes    Types: Marijuana     Allergies   Kiwi extract   Review of Systems Review of Systems  Constitutional: Negative for fever.  Musculoskeletal: Positive for arthralgias.     Physical Exam Updated Vital Signs BP (!) 112/96   Pulse 75   Temp 98.6 F (37 C) (Oral)   Resp 18   Ht 5\' 9"  (1.753 m)   Wt (!) 162.4 kg   SpO2 98%   BMI 52.87 kg/m   Physical Exam Vitals signs and nursing note reviewed.  Constitutional:      Appearance: He is well-developed.  HENT:     Head: Normocephalic and atraumatic.  Eyes:     General: No scleral icterus.    Pupils: Pupils are equal, round, and reactive to light.  Neck:     Musculoskeletal: Normal range of motion.  Cardiovascular:     Heart sounds: Normal heart sounds.  Pulmonary:     Effort: Pulmonary effort is  normal.     Breath sounds: Normal breath sounds. No wheezing.  Chest:     Chest wall: No tenderness.  Abdominal:     General: Bowel sounds are normal. There is no distension.     Palpations: Abdomen is soft.     Tenderness: There is no abdominal tenderness.  Musculoskeletal:        General: No deformity.     Left foot: Normal capillary refill. Tenderness and swelling present. No crepitus, deformity or laceration.     Comments: Pulses present, capillary refill is intact.  Decreased range of motion due to pain.  Mild swelling noted to ankle.  Skin:    General: Skin is warm and dry.  Neurological:     Mental Status: He is alert and oriented to person, place, and time.      ED Treatments / Results  Labs (all labs ordered are listed, but only abnormal results are displayed) Labs Reviewed - No data to display  EKG None  Radiology Dg Ankle Complete Left  Result  Date: 11/20/2018 CLINICAL DATA:  Left ankle swelling. Pain after cutting grass and twisting ankle. EXAM: LEFT ANKLE COMPLETE - 3+ VIEW COMPARISON:  Ankle radiograph 05/04/2018 FINDINGS: There is no evidence of fracture, dislocation, or joint effusion. Achilles tendon enthesophyte. There is no evidence of arthropathy or other focal bone abnormality. Mild soft tissue edema. IMPRESSION: Mild soft tissue edema. No acute osseous abnormality. Electronically Signed   By: Narda RutherfordMelanie  Sanford M.D.   On: 11/20/2018 02:14    Procedures Procedures (including critical care time)  Medications Ordered in ED Medications - No data to display   Initial Impression / Assessment and Plan / ED Course  I have reviewed the triage vital signs and the nursing notes.  Pertinent labs & imaging results that were available during my care of the patient were reviewed by me and considered in my medical decision making (see chart for details).     Patient with a past medical history of hypertension presents to the ED with complaints of left ankle pain after twisting it while mowing the lawn yesterday.  Reports he is had pain worse with ambulation.  There is mild swelling noted to the area, neurovascular exam is intact.  Patient has been followed by podiatry in the past, reports he will need a referral for orthopedics for this ongoing concern.  An x-ray was obtained of his left ankle complete, there was no evidence of fracture, dislocation, soft tissue edema was noted.  These results were discussed with patient, he will be given a referral for orthopedics in order to follow-up.  He will also be sent home with a prescription for naproxen to help with his symptoms.  Patient does have a boot at the bedside, states he has been using this for his right ankle sprains in the past, encouraged to only wear when needed.  Rice therapy was also encouraged for patient.  With otherwise unremarkable vital signs, patient stable for discharge.   Portions of this note were generated with Scientist, clinical (histocompatibility and immunogenetics)Dragon dictation software. Dictation errors may occur despite best attempts at proofreading.  Final Clinical Impressions(s) / ED Diagnoses   Final diagnoses:  Sprain of left ankle, unspecified ligament, initial encounter    ED Discharge Orders         Ordered    naproxen (NAPROSYN) 500 MG tablet  2 times daily     11/20/18 0402           Claude MangesSoto, Talis Iwan, PA-C 11/20/18 0405  Palumbo, April, MD 11/20/18 [phone]

## 2018-11-23 DIAGNOSIS — M25572 Pain in left ankle and joints of left foot: Secondary | ICD-10-CM | POA: Diagnosis not present

## 2018-11-23 DIAGNOSIS — M25571 Pain in right ankle and joints of right foot: Secondary | ICD-10-CM | POA: Diagnosis not present

## 2018-11-26 DIAGNOSIS — M6281 Muscle weakness (generalized): Secondary | ICD-10-CM | POA: Diagnosis not present

## 2018-11-26 DIAGNOSIS — M25672 Stiffness of left ankle, not elsewhere classified: Secondary | ICD-10-CM | POA: Diagnosis not present

## 2018-11-26 DIAGNOSIS — M25571 Pain in right ankle and joints of right foot: Secondary | ICD-10-CM | POA: Diagnosis not present

## 2018-11-26 DIAGNOSIS — M25572 Pain in left ankle and joints of left foot: Secondary | ICD-10-CM | POA: Diagnosis not present

## 2018-12-01 DIAGNOSIS — M25571 Pain in right ankle and joints of right foot: Secondary | ICD-10-CM | POA: Diagnosis not present

## 2018-12-01 DIAGNOSIS — M6281 Muscle weakness (generalized): Secondary | ICD-10-CM | POA: Diagnosis not present

## 2018-12-01 DIAGNOSIS — M25572 Pain in left ankle and joints of left foot: Secondary | ICD-10-CM | POA: Diagnosis not present

## 2018-12-01 DIAGNOSIS — M25672 Stiffness of left ankle, not elsewhere classified: Secondary | ICD-10-CM | POA: Diagnosis not present

## 2018-12-03 DIAGNOSIS — M6281 Muscle weakness (generalized): Secondary | ICD-10-CM | POA: Diagnosis not present

## 2018-12-03 DIAGNOSIS — M25672 Stiffness of left ankle, not elsewhere classified: Secondary | ICD-10-CM | POA: Diagnosis not present

## 2018-12-03 DIAGNOSIS — M25571 Pain in right ankle and joints of right foot: Secondary | ICD-10-CM | POA: Diagnosis not present

## 2018-12-03 DIAGNOSIS — M25572 Pain in left ankle and joints of left foot: Secondary | ICD-10-CM | POA: Diagnosis not present

## 2018-12-08 DIAGNOSIS — M25672 Stiffness of left ankle, not elsewhere classified: Secondary | ICD-10-CM | POA: Diagnosis not present

## 2018-12-08 DIAGNOSIS — M25571 Pain in right ankle and joints of right foot: Secondary | ICD-10-CM | POA: Diagnosis not present

## 2018-12-08 DIAGNOSIS — M25572 Pain in left ankle and joints of left foot: Secondary | ICD-10-CM | POA: Diagnosis not present

## 2018-12-08 DIAGNOSIS — M6281 Muscle weakness (generalized): Secondary | ICD-10-CM | POA: Diagnosis not present

## 2018-12-15 DIAGNOSIS — M6281 Muscle weakness (generalized): Secondary | ICD-10-CM | POA: Diagnosis not present

## 2018-12-15 DIAGNOSIS — M25672 Stiffness of left ankle, not elsewhere classified: Secondary | ICD-10-CM | POA: Diagnosis not present

## 2018-12-15 DIAGNOSIS — M25572 Pain in left ankle and joints of left foot: Secondary | ICD-10-CM | POA: Diagnosis not present

## 2018-12-15 DIAGNOSIS — M25571 Pain in right ankle and joints of right foot: Secondary | ICD-10-CM | POA: Diagnosis not present

## 2018-12-17 DIAGNOSIS — M25571 Pain in right ankle and joints of right foot: Secondary | ICD-10-CM | POA: Diagnosis not present

## 2018-12-17 DIAGNOSIS — M6281 Muscle weakness (generalized): Secondary | ICD-10-CM | POA: Diagnosis not present

## 2018-12-17 DIAGNOSIS — M25572 Pain in left ankle and joints of left foot: Secondary | ICD-10-CM | POA: Diagnosis not present

## 2018-12-17 DIAGNOSIS — M25672 Stiffness of left ankle, not elsewhere classified: Secondary | ICD-10-CM | POA: Diagnosis not present

## 2018-12-18 DIAGNOSIS — M25562 Pain in left knee: Secondary | ICD-10-CM | POA: Diagnosis not present

## 2018-12-21 ENCOUNTER — Other Ambulatory Visit: Payer: Self-pay | Admitting: Orthopedic Surgery

## 2018-12-21 DIAGNOSIS — M25562 Pain in left knee: Secondary | ICD-10-CM

## 2019-01-09 ENCOUNTER — Other Ambulatory Visit: Payer: Self-pay

## 2019-01-09 ENCOUNTER — Ambulatory Visit
Admission: RE | Admit: 2019-01-09 | Discharge: 2019-01-09 | Disposition: A | Discharge status: Home / Self Care | Payer: BC Managed Care – PPO | Source: Ambulatory Visit | Attending: Orthopedic Surgery | Admitting: Orthopedic Surgery

## 2019-01-09 DIAGNOSIS — M25562 Pain in left knee: Secondary | ICD-10-CM

## 2019-01-09 DIAGNOSIS — R600 Localized edema: Secondary | ICD-10-CM | POA: Diagnosis not present

## 2019-02-12 DIAGNOSIS — F3342 Major depressive disorder, recurrent, in full remission: Secondary | ICD-10-CM | POA: Diagnosis not present

## 2019-02-22 DIAGNOSIS — M25562 Pain in left knee: Secondary | ICD-10-CM | POA: Diagnosis not present

## 2019-02-22 DIAGNOSIS — M25571 Pain in right ankle and joints of right foot: Secondary | ICD-10-CM | POA: Diagnosis not present

## 2019-05-19 DIAGNOSIS — F3341 Major depressive disorder, recurrent, in partial remission: Secondary | ICD-10-CM | POA: Diagnosis not present

## 2019-05-19 DIAGNOSIS — I10 Essential (primary) hypertension: Secondary | ICD-10-CM | POA: Diagnosis not present

## 2019-05-19 DIAGNOSIS — R7303 Prediabetes: Secondary | ICD-10-CM | POA: Diagnosis not present

## 2019-05-27 DIAGNOSIS — I10 Essential (primary) hypertension: Secondary | ICD-10-CM | POA: Diagnosis not present

## 2019-05-27 DIAGNOSIS — R7303 Prediabetes: Secondary | ICD-10-CM | POA: Diagnosis not present

## 2019-07-15 DIAGNOSIS — Z0000 Encounter for general adult medical examination without abnormal findings: Secondary | ICD-10-CM | POA: Diagnosis not present

## 2019-12-13 DIAGNOSIS — F3342 Major depressive disorder, recurrent, in full remission: Secondary | ICD-10-CM | POA: Diagnosis not present

## 2020-01-18 DIAGNOSIS — F3341 Major depressive disorder, recurrent, in partial remission: Secondary | ICD-10-CM | POA: Diagnosis not present

## 2020-01-18 DIAGNOSIS — I10 Essential (primary) hypertension: Secondary | ICD-10-CM | POA: Diagnosis not present

## 2020-01-18 DIAGNOSIS — Z23 Encounter for immunization: Secondary | ICD-10-CM | POA: Diagnosis not present

## 2020-01-18 DIAGNOSIS — R7303 Prediabetes: Secondary | ICD-10-CM | POA: Diagnosis not present

## 2020-01-20 ENCOUNTER — Ambulatory Visit
Admission: EM | Admit: 2020-01-20 | Discharge: 2020-01-20 | Disposition: A | Discharge status: Home / Self Care | Payer: BC Managed Care – PPO

## 2020-01-20 ENCOUNTER — Encounter: Payer: Self-pay | Admitting: *Deleted

## 2020-01-20 ENCOUNTER — Other Ambulatory Visit: Payer: Self-pay

## 2020-01-20 DIAGNOSIS — Z0130 Encounter for examination of blood pressure without abnormal findings: Secondary | ICD-10-CM

## 2020-01-20 NOTE — ED Provider Notes (Signed)
Emergency Department Provider Note  ____________________________________________  Time seen: Approximately 1:56 PM  I have reviewed the triage vital signs and the nursing notes.   HISTORY  Chief Complaint No chief complaint on file.   Historian Patient    HPI Tyler Cross is a 34 y.o. male with a history of hypertension, presents to the urgent care for a blood pressure check.  Patient was recently started on losartan and has been tolerating medication well.  Patient has an at home blood pressure machine which has been reading in the 200s for systolic in 100s for diastolic.  Patient measured his blood pressure here with consistent readings for what he has obtained at home.  Blood pressure was 129/79 at triage.  He denies headache, chest pain or shortness of breath.   Past Medical History:  Diagnosis Date  . Anxiety   . Depression   . Hypertension      Immunizations up to date:  Yes.     Past Medical History:  Diagnosis Date  . Anxiety   . Depression   . Hypertension     There are no problems to display for this patient.   Past Surgical History:  Procedure Laterality Date  . WISDOM TOOTH EXTRACTION      Prior to Admission medications   Medication Sig Start Date End Date Taking? Authorizing Provider  buPROPion (WELLBUTRIN XL) 150 MG 24 hr tablet  04/24/18  Yes [provider]  chlorthalidone (HYGROTON) 25 MG tablet Take 0.5 tablets (12.5 mg total) by mouth daily. 08/24/17  Yes Ofilia Neas, PA-C  losartan (COZAAR) 50 MG tablet Take 50 mg by mouth daily.   Yes [provider]  diclofenac (VOLTAREN) 75 MG EC tablet Take 1 tablet (75 mg total) by mouth 2 (two) times daily. 08/10/18   Mardella Layman, MD  UNKNOWN TO PATIENT     [provider]  VIIBRYD 40 MG TABS  04/24/18   [provider]    Allergies Kiwi extract  Family History  Problem Relation Age of Onset  . Stroke Mother   . Fibromyalgia Mother   . Healthy  Father     Social History Social History   Tobacco Use  . Smoking status: Never Smoker  . Smokeless tobacco: Never Used  Vaping Use  . Vaping Use: Never used  Substance Use Topics  . Alcohol use: Yes    Comment: occasionally  . Drug use: Yes    Types: Marijuana     Review of Systems  Constitutional: No fever/chills Eyes:  No discharge ENT: No upper respiratory complaints. Respiratory: no cough. No SOB/ use of accessory muscles to breath Gastrointestinal:   No nausea, no vomiting.  No diarrhea.  No constipation. Musculoskeletal: Negative for musculoskeletal pain. Skin: Negative for rash, abrasions, lacerations, ecchymosis.    ____________________________________________   PHYSICAL EXAM:  VITAL SIGNS: ED Triage Vitals  Enc Vitals Group     BP 01/20/20 1345 126/79     Pulse Rate 01/20/20 1345 92     Resp 01/20/20 1345 (!) 22     Temp 01/20/20 1345 98.8 F (37.1 C)     Temp Source 01/20/20 1345 Oral     SpO2 01/20/20 1345 98 %     Weight --      Height --      Head Circumference --      Peak Flow --      Pain Score 01/20/20 1349 0     Pain Loc --  Pain Edu? --      Excl. in GC? --      Constitutional: Alert and oriented. Well appearing and in no acute distress. Eyes: Conjunctivae are normal. PERRL. EOMI. Head: Atraumatic. ENT:      Nose: No congestion/rhinnorhea.      Mouth/Throat: Mucous membranes are moist.  Neck: No stridor.  No cervical spine tenderness to palpation. Cardiovascular: Normal rate, regular rhythm. Normal S1 and S2.  Good peripheral circulation. Respiratory: Normal respiratory effort without tachypnea or retractions. Lungs CTAB. Good air entry to the bases with no decreased or absent breath sounds Gastrointestinal: Bowel sounds x 4 quadrants. Soft and nontender to palpation. No guarding or rigidity. No distention. Musculoskeletal: Full range of motion to all extremities. No obvious deformities noted Neurologic:  Normal for age. No  gross focal neurologic deficits are appreciated.  Skin:  Skin is warm, dry and intact. No rash noted. Psychiatric: Mood and affect are normal for age. Speech and behavior are normal.   ____________________________________________   LABS (all labs ordered are listed, but only abnormal results are displayed)  Labs Reviewed - No data to display ____________________________________________  EKG   ____________________________________________  RADIOLOGY   No results found.  ____________________________________________    PROCEDURES  Procedure(s) performed:     Procedures     Medications - No data to display   ____________________________________________   INITIAL IMPRESSION / ASSESSMENT AND PLAN / ED COURSE  Pertinent labs & imaging results that were available during my care of the patient were reviewed by me and considered in my medical decision making (see chart for details).      Assessment and plan Blood pressure check 34 year old male presents to the urgent care for blood pressure check.  Patient is concerned that his blood pressure machine is broken.  Patient reassuring blood pressure at urgent care today.  Recommended continuing with losartan at home.  Return precautions were given to return with new or worsening symptoms.  ____________________________________________  FINAL CLINICAL IMPRESSION(S) / ED DIAGNOSES  Final diagnoses:  Blood pressure check      NEW MEDICATIONS STARTED DURING THIS VISIT:  ED Discharge Orders    None          This chart was dictated using voice recognition software/Dragon. Despite best efforts to proofread, errors can occur which can change the meaning. Any change was purely unintentional.     Orvil Feil, PA-C 01/20/20 1359

## 2020-01-20 NOTE — ED Triage Notes (Addendum)
Pt states just started losartan 2 days ago.  Pt was told to take daily BPs; today was 213/157 at home with automatic cuff 2 hrs after taking losartan. At present, pt checked his automatic cuff to verify accuracy to [facility] machine - continues to read in 200s/120s. Denies any HA, vision changes, or any c/o's.

## 2020-02-23 ENCOUNTER — Ambulatory Visit
Admission: EM | Admit: 2020-02-23 | Discharge: 2020-02-23 | Disposition: A | Discharge status: Home / Self Care | Payer: BC Managed Care – PPO | Attending: Family Medicine | Admitting: Family Medicine

## 2020-02-23 ENCOUNTER — Encounter: Payer: Self-pay | Admitting: Family Medicine

## 2020-02-23 ENCOUNTER — Other Ambulatory Visit: Payer: Self-pay

## 2020-02-23 DIAGNOSIS — J029 Acute pharyngitis, unspecified: Secondary | ICD-10-CM | POA: Insufficient documentation

## 2020-02-23 DIAGNOSIS — R059 Cough, unspecified: Secondary | ICD-10-CM | POA: Diagnosis not present

## 2020-02-23 LAB — POCT RAPID STREP A (OFFICE): Rapid Strep A Screen: NEGATIVE

## 2020-02-23 MED ORDER — BENZONATATE 100 MG PO CAPS: 100.0000 mg | ORAL_CAPSULE | Freq: Three times a day (TID) | ORAL | 0 refills | Status: AC | PRN

## 2020-02-23 MED ORDER — AZITHROMYCIN 250 MG PO TABS: ORAL_TABLET | ORAL | 0 refills | Status: AC

## 2020-02-23 NOTE — ED Triage Notes (Signed)
Pt c/o sore throat and slight cough for over a week and a half. States hx of strep and feels similar .

## 2020-02-23 NOTE — ED Provider Notes (Signed)
[facility]    CSN: [id number] Arrival date & time: 02/23/20  3762      History   Chief Complaint Chief Complaint  Patient presents with  . Sore Throat    HPI Tyler Cross is a 34 y.o. male.   This is a 33 [facility] old [facility] who presents with sore throat and dry cough for 10 days.  No fever or GI sx.  Works from home.  No known Covid exposure.  Married, wife has no sx.     Past Medical History:  Diagnosis Date  . Anxiety   . Depression   . Hypertension     There are no problems to display for this patient.   Past Surgical History:  Procedure Laterality Date  . WISDOM TOOTH EXTRACTION         Home Medications    Prior to Admission medications   Medication Sig Start Date End Date Taking? Authorizing Provider  azithromycin (ZITHROMAX) 250 MG tablet Take 2 tabs PO x 1 dose, then 1 tab PO QD x 4 days 02/23/20   [name], MD  benzonatate (TESSALON) 100 MG capsule Take 1-2 capsules (100-200 mg total) by mouth 3 (three) times daily as needed for cough. 02/23/20   [name], MD  buPROPion (WELLBUTRIN XL) 150 MG 24 hr tablet  04/24/18   [provider]  chlorthalidone (HYGROTON) 25 MG tablet Take 0.5 tablets (12.5 mg total) by mouth daily. 08/24/17   Ofilia Neas, PA-C  losartan (COZAAR) 50 MG tablet Take 50 mg by mouth daily.    [provider]    Family History Family History  Problem Relation Age of Onset  . Stroke Mother   . Fibromyalgia Mother   . Healthy Father     Social History Social History   Tobacco Use  . Smoking status: Never Smoker  . Smokeless tobacco: Never Used  Vaping Use  . Vaping Use: Never used  Substance Use Topics  . Alcohol use: Yes    Comment: occasionally  . Drug use: Not Currently     Allergies   Kiwi extract   Review of Systems Review of Systems  Constitutional: Negative.   HENT: Positive for sore throat.   Respiratory: Positive for  cough.      Physical Exam Triage Vital Signs ED Triage Vitals  Enc Vitals Group     BP      Pulse      Resp      Temp      Temp src      SpO2      Weight      Height      Head Circumference      Peak Flow      Pain Score      Pain Loc      Pain Edu?      Excl. in GC?    No data found.  Updated Vital Signs BP 123/85 (BP Location: Left Arm)   Pulse 92   Temp 98.9 F (37.2 C) (Oral)   Resp 18   SpO2 96%    Physical Exam Vitals and nursing note reviewed.  Constitutional:      Appearance: He is well-developed. He is obese. He is not ill-appearing.  HENT:     Mouth/Throat:     Mouth: Mucous membranes are moist.     Pharynx: Posterior oropharyngeal erythema present. No pharyngeal swelling.     Tonsils: No tonsillar exudate.  Eyes:  Conjunctiva/sclera: Conjunctivae normal.  Cardiovascular:     Heart sounds: Normal heart sounds.  Pulmonary:     Effort: Pulmonary effort is normal.     Breath sounds: Normal breath sounds.  Musculoskeletal:     Cervical back: Normal range of motion and neck supple.  Skin:    General: Skin is warm and dry.  Neurological:     General: No focal deficit present.     Mental Status: He is alert and oriented to person, place, and time.  Psychiatric:        Mood and Affect: Mood normal.      UC Treatments / Results  Labs (all labs ordered are listed, but only abnormal results are displayed) Labs Reviewed  POCT RAPID STREP A (OFFICE)    EKG   Radiology No results found.  Procedures Procedures (including critical care time)  Medications Ordered in UC Medications - No data to display  Initial Impression / Assessment and Plan / UC Course  I have reviewed the triage vital signs and the nursing notes.  Pertinent labs & imaging results that were available during my care of the patient were reviewed by me and considered in my medical decision making (see chart for details).    Final Clinical Impressions(s) / UC Diagnoses    Final diagnoses:  Acute pharyngitis, unspecified etiology  Cough   Discharge Instructions   None    ED Prescriptions    Medication Sig Dispense Auth. Provider   azithromycin (ZITHROMAX) 250 MG tablet Take 2 tabs PO x 1 dose, then 1 tab PO QD x 4 days 6 tablet [name], MD   benzonatate (TESSALON) 100 MG capsule Take 1-2 capsules (100-200 mg total) by mouth 3 (three) times daily as needed for cough. 40 capsule [name], MD     I have reviewed the PDMP during this encounter.   [name], MD 02/23/20 [phone]

## 2020-02-25 LAB — CULTURE, GROUP A STREP (THRC)

## 2020-05-31 DIAGNOSIS — L918 Other hypertrophic disorders of the skin: Secondary | ICD-10-CM | POA: Diagnosis not present

## 2020-05-31 DIAGNOSIS — L821 Other seborrheic keratosis: Secondary | ICD-10-CM | POA: Diagnosis not present

## 2020-05-31 DIAGNOSIS — D485 Neoplasm of uncertain behavior of skin: Secondary | ICD-10-CM | POA: Diagnosis not present

## 2020-12-15 IMAGING — DX PORTABLE CHEST - 1 VIEW
1 series · 1 of 1 positions shown · non-contrast
Comparison: None.

CLINICAL DATA: Productive cough for several weeks.

EXAM:
PORTABLE CHEST 1 VIEW

[chest ap]
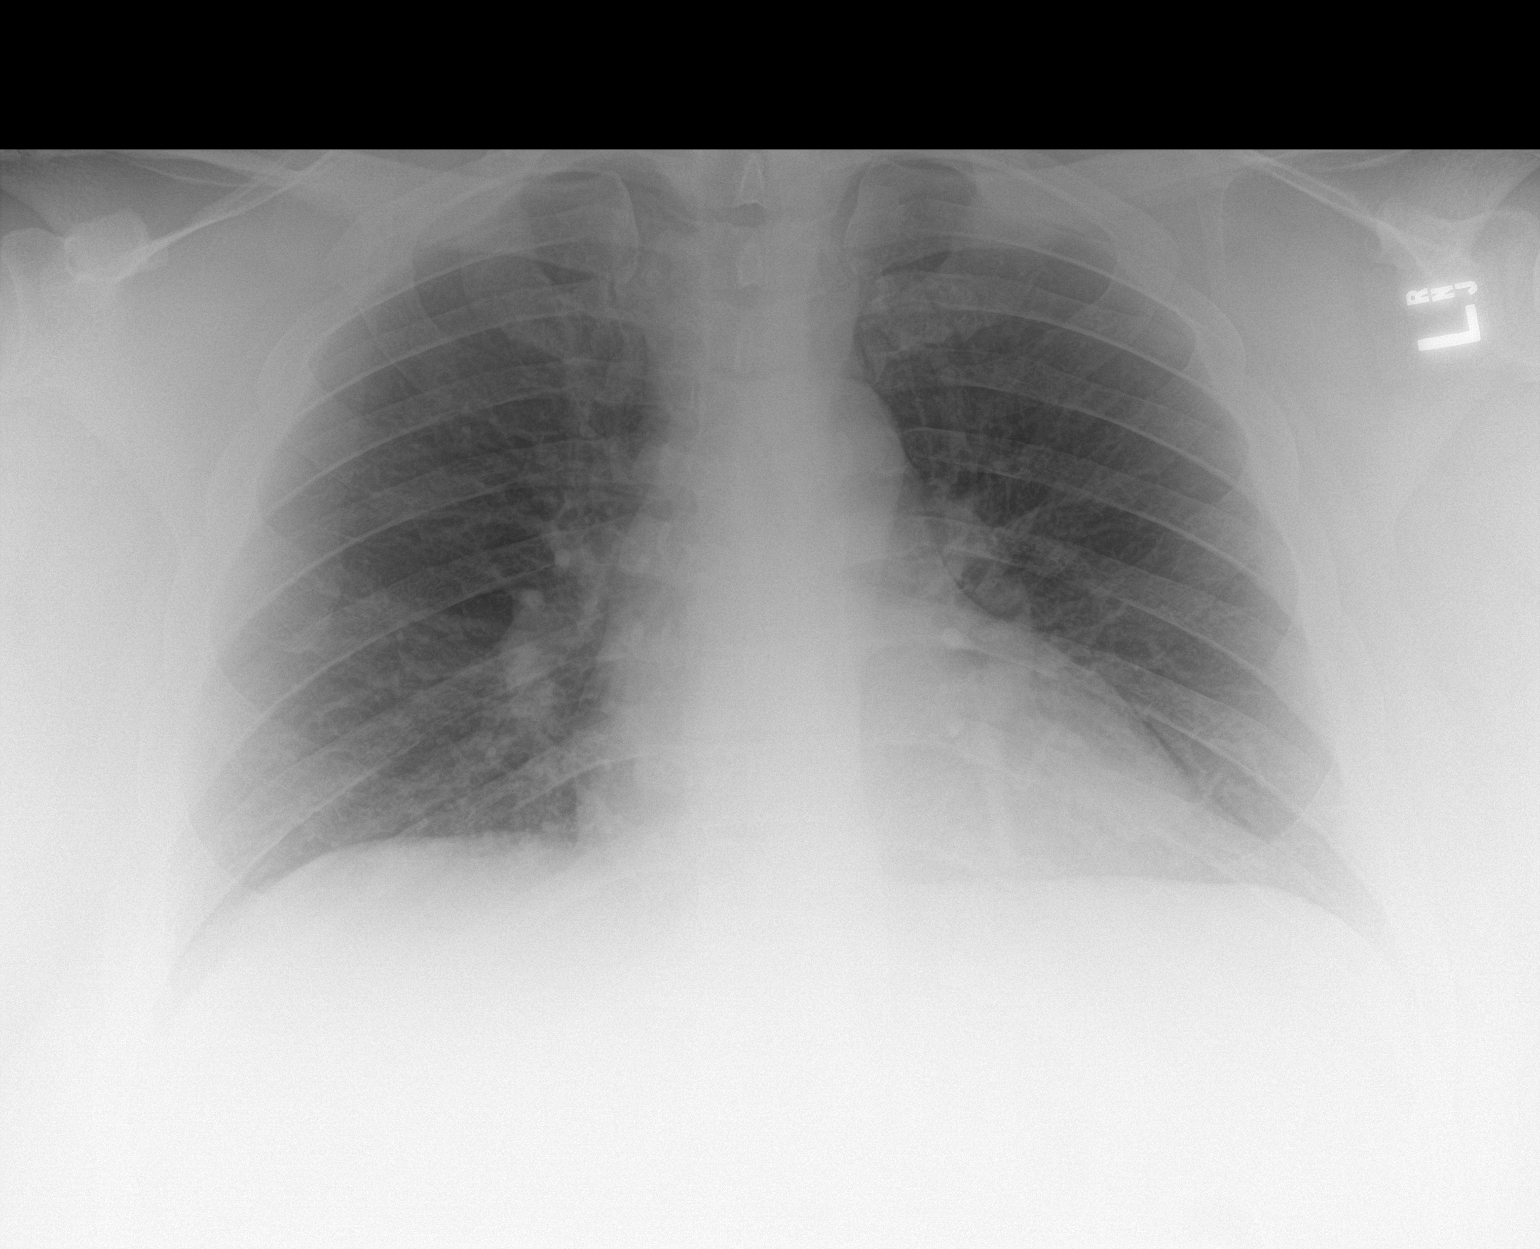

[1 of 1 positions shown; findings below may reference images not displayed]

FINDINGS: Lungs clear. Heart size normal. No pneumothorax or pleural effusion.
No acute or focal bony abnormality.
IMPRESSION: Negative chest.

## 2021-02-26 IMAGING — MR MR KNEE*L* W/O CM
4 of 7 series · 23 of 40 positions shown · non-contrast
Comparison: Left knee x-rays dated August 02, 2016.

CLINICAL DATA: Persistent anterior knee pain since injury jumping
off a stage one month ago.

EXAM:
MRI OF THE LEFT KNEE WITHOUT CONTRAST
TECHNIQUE: Multiplanar, multisequence MR imaging of the knee was performed. No
intravenous contrast was administered.

[Series 4: T2 fat-sat · coronal · 4.0mm · 0.62mm/px · 6 of 35 slices shown (1 of 2)]
[im 1/35]
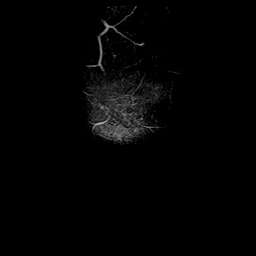
[im 7/35]
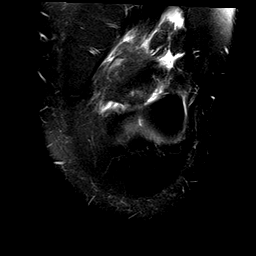
[im 14/35]
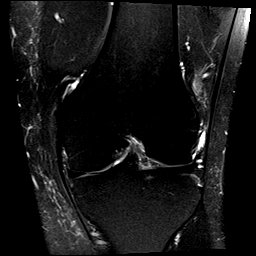
[im 21/35]
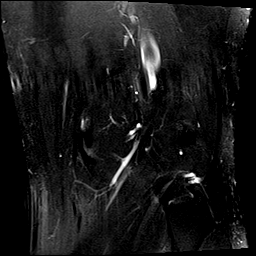
[im 28/35]
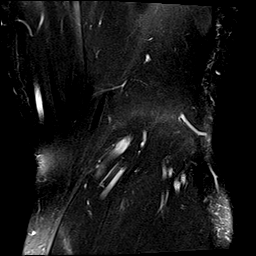
[im 35/35]
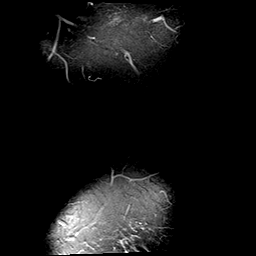

[Series 5: T1 · coronal · 4.0mm · 0.29mm/px · 5 of 35 slices shown]
[im 1/35]
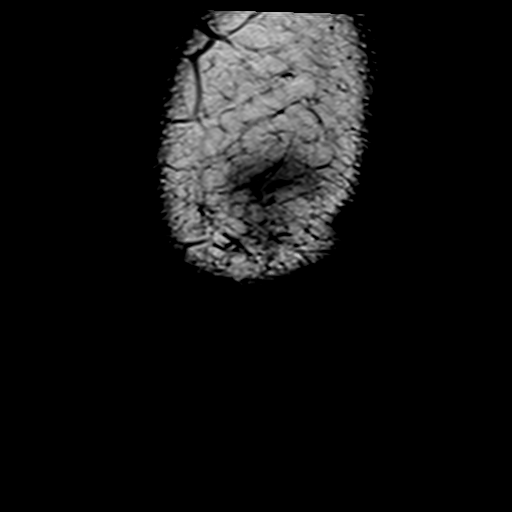
[im 6/35]
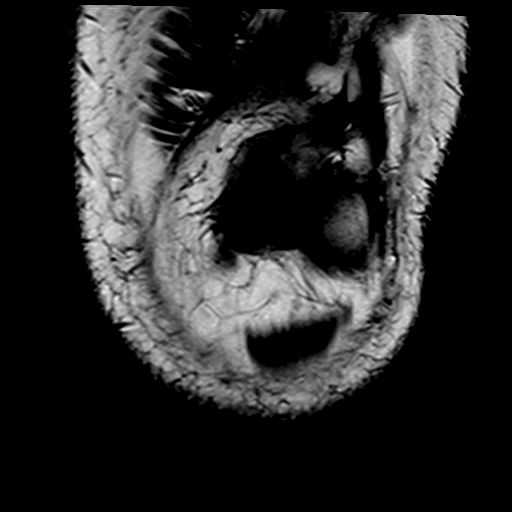
[im 12/35]
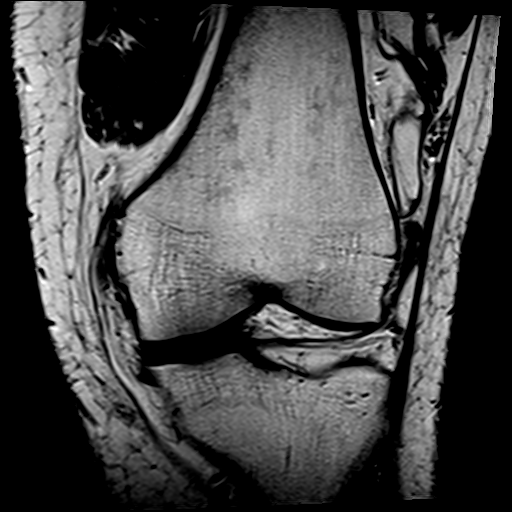
[im 18/35]
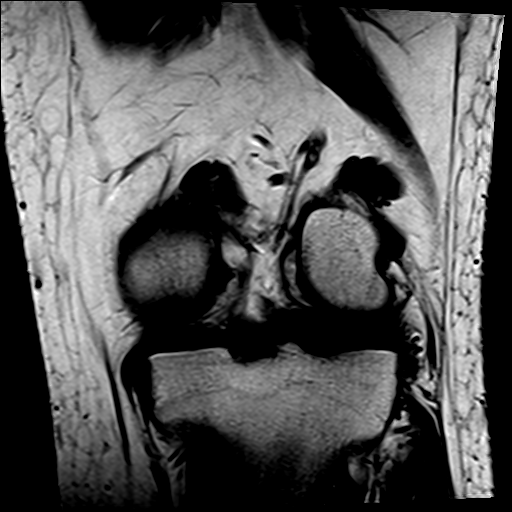
[im 29/35]
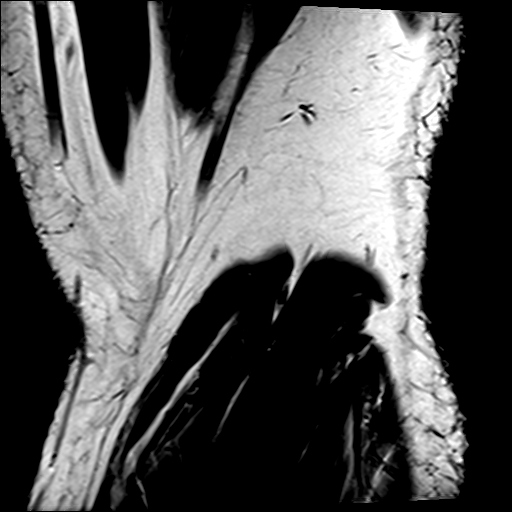

[Series 7: PD fat-sat · sagittal · 3.0mm · 0.31mm/px · 6 of 30 slices shown]
[im 1/30]
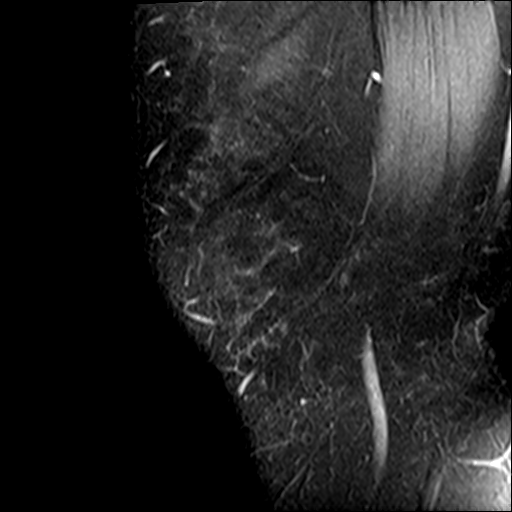
[im 6/30]
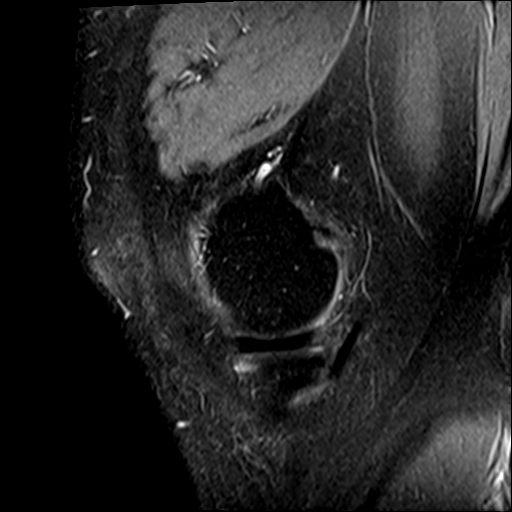
[im 12/30]
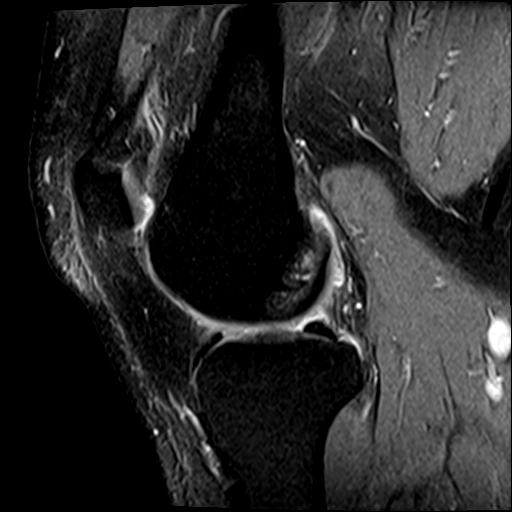
[im 18/30]
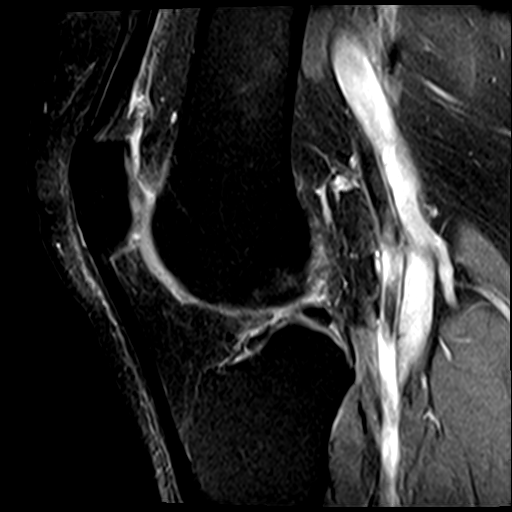
[im 24/30]
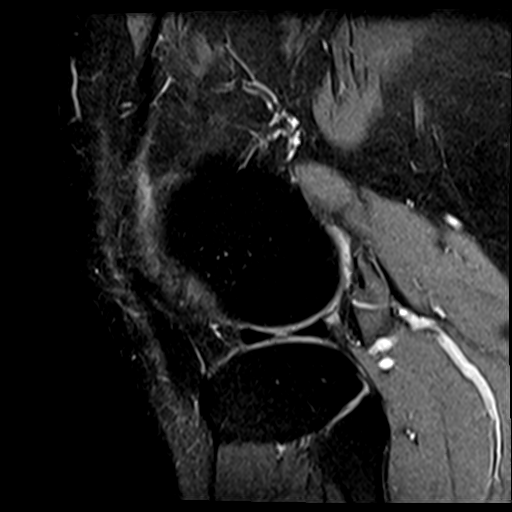
[im 30/30]
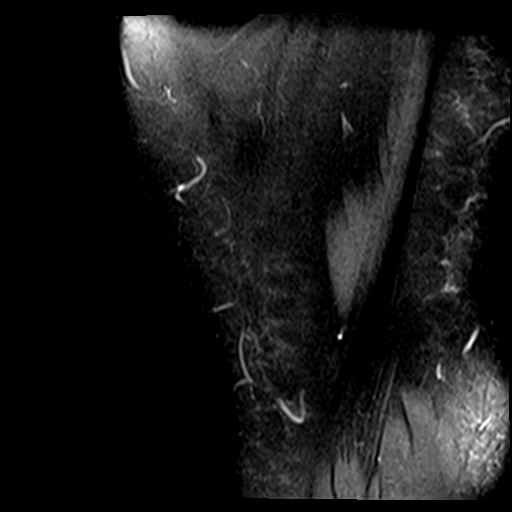

[Series 8: T2 fat-sat · sagittal · 3.0mm · 0.29mm/px · 6 of 30 slices shown (2 of 2)]
[im 1/30]
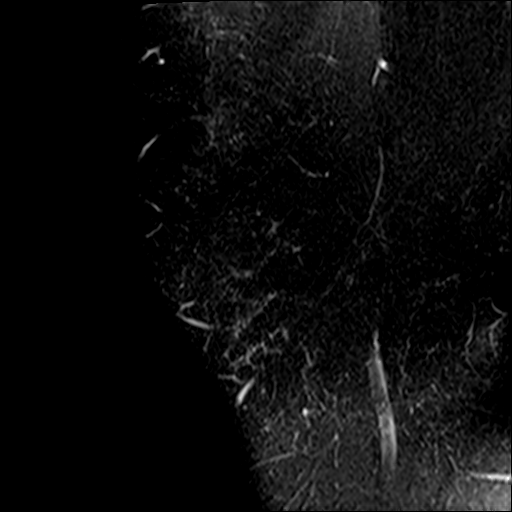
[im 6/30]
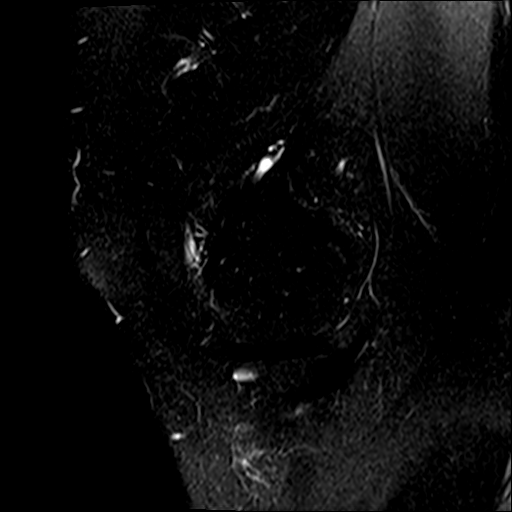
[im 12/30]
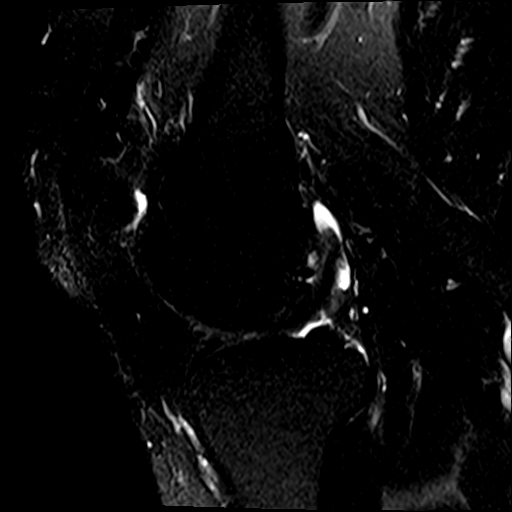
[im 18/30]
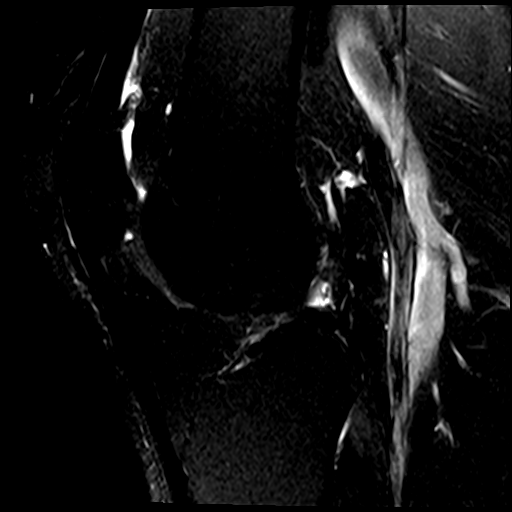
[im 24/30]
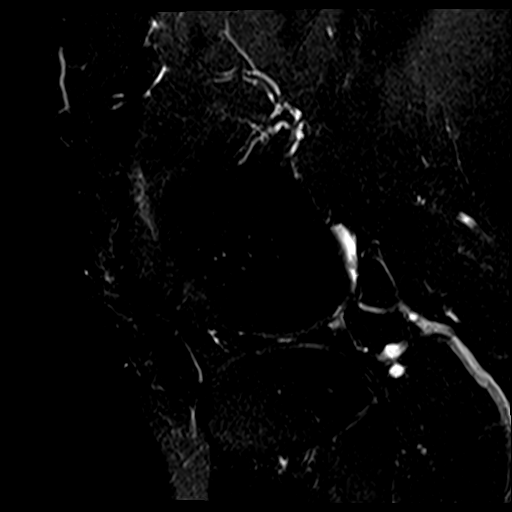
[im 30/30]
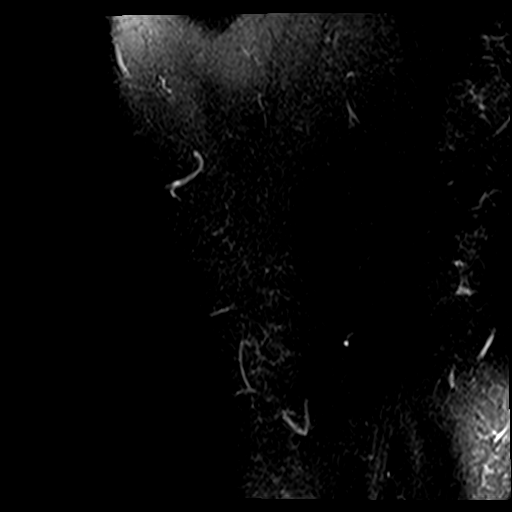

[23 of 40 positions shown; findings below may reference images not displayed]

FINDINGS: MENISCI

Medial meniscus:  Intact.

Lateral meniscus:  Intact.

LIGAMENTS

Cruciates:  Intact ACL and PCL.

Collaterals: Medial collateral ligament is intact. Lateral
collateral ligament complex is intact.

CARTILAGE

Patellofemoral: Focal full-thickness fissuring over the trochlear
groove (series 3, image 16).

Medial:  Normal.

Lateral:  Normal.

Joint: Trace joint effusion. Edema in the superolateral aspect of
Hoffa's fat. No plical thickening.

Popliteal Fossa:  No Baker cyst. Intact popliteus tendon.

Extensor Mechanism: Intact quadriceps tendon and patellar tendon.
Intact medial and lateral patellar retinaculum. Intact MPFL.

Bones: No focal marrow signal abnormality. No fracture or
dislocation. The tibial tubercle/trochlear groove (TT-TG) distance
is 15 mm.

Other: None.
IMPRESSION: 1. No meniscal or ligamentous injury.
2. Focal full-thickness cartilage fissuring over the trochlear
groove.
3. Focal abnormal edema in the superolateral aspect of Hoffa's fat
pad, which can be seen in the setting of patellar maltracking.

## 2021-10-31 ENCOUNTER — Ambulatory Visit
Admission: EM | Admit: 2021-10-31 | Discharge: 2021-10-31 | Disposition: A | Discharge status: Home / Self Care | Payer: BLUE CROSS/BLUE SHIELD | Attending: Internal Medicine | Admitting: Internal Medicine

## 2021-10-31 ENCOUNTER — Encounter: Payer: Self-pay | Admitting: Emergency Medicine

## 2021-10-31 DIAGNOSIS — M25562 Pain in left knee: Secondary | ICD-10-CM

## 2021-10-31 NOTE — Discharge Instructions (Signed)
You have inflammation of your knee.  Please take ibuprofen as needed as we discussed.  You may supplement with Tylenol.  Continue right knee brace.  Recommend ice application and rest as well.  Follow-up with orthopedist at provided contact information if symptoms persist or worsen.

## 2021-10-31 NOTE — ED Provider Notes (Signed)
[facility]    CSN: [id number] Arrival date & time: 10/31/21  0853      History   Chief Complaint Chief Complaint  Patient presents with   Knee Pain    HPI Tyler Cross is a 36 y.o. male.   Patient presents with left knee pain that started a little over a week ago.  Denies any obvious injury.  Patient reports that he was having right knee pain that has now resolved.  He is attributing left knee pain to having to shift weight to counteract right knee pain. Has taken Tylenol this morning for pain but denies any other medications.  Denies numbness or tingling.  Pain with bearing weight.  Denies history of chronic knee pain.  He has been wearing a knee brace.   Knee Pain   Past Medical History:  Diagnosis Date   Anxiety    Depression    Hypertension     There are no problems to display for this patient.   Past Surgical History:  Procedure Laterality Date   WISDOM TOOTH EXTRACTION         Home Medications    Prior to Admission medications   Medication Sig Start Date End Date Taking? Authorizing Provider  azithromycin (ZITHROMAX) 250 MG tablet Take 2 tabs PO x 1 dose, then 1 tab PO QD x 4 days 02/23/20   [name], MD  benzonatate (TESSALON) 100 MG capsule Take 1-2 capsules (100-200 mg total) by mouth 3 (three) times daily as needed for cough. 02/23/20   [name], MD  buPROPion (WELLBUTRIN XL) 150 MG 24 hr tablet  04/24/18   [provider]  chlorthalidone (HYGROTON) 25 MG tablet Take 0.5 tablets (12.5 mg total) by mouth daily. 08/24/17   Ofilia Neas, PA-C  losartan (COZAAR) 50 MG tablet Take 50 mg by mouth daily.    [provider]    Family History Family History  Problem Relation Age of Onset   Stroke Mother    Fibromyalgia Mother    Healthy Father     Social History Social History   Tobacco Use   Smoking status: Never   Smokeless tobacco: Never  Vaping Use   Vaping Use: Never used  Substance Use  Topics   Alcohol use: Yes    Comment: occasionally   Drug use: Not Currently     Allergies   Kiwi extract   Review of Systems Review of Systems Per HPI  Physical Exam Triage Vital Signs ED Triage Vitals  Enc Vitals Group     BP 10/31/21 0951 (!) 151/105     Pulse Rate 10/31/21 0951 (!) 106     Resp 10/31/21 0951 18     Temp 10/31/21 0951 98.2 F (36.8 C)     Temp src --      SpO2 10/31/21 0951 95 %     Weight --      Height --      Head Circumference --      Peak Flow --      Pain Score 10/31/21 0950 7     Pain Loc --      Pain Edu? --      Excl. in GC? --    No data found.  Updated Vital Signs BP (!) 149/96   Pulse (!) 109   Temp 98.2 F (36.8 C)   Resp 18   SpO2 95%   Visual Acuity Right Eye Distance:   Left Eye Distance:  Bilateral Distance:    Right Eye Near:   Left Eye Near:    Bilateral Near:     Physical Exam Constitutional:      General: He is not in acute distress.    Appearance: Normal appearance. He is not toxic-appearing or diaphoretic.  HENT:     Head: Normocephalic and atraumatic.  Eyes:     Extraocular Movements: Extraocular movements intact.     Conjunctiva/sclera: Conjunctivae normal.     Pupils: Pupils are equal, round, and reactive to light.  Cardiovascular:     Rate and Rhythm: Normal rate and regular rhythm.     Pulses: Normal pulses.     Heart sounds: Normal heart sounds.  Pulmonary:     Effort: Pulmonary effort is normal. No respiratory distress.     Breath sounds: Normal breath sounds.  Musculoskeletal:     Left knee: No crepitus. Tenderness present. No LCL laxity, MCL laxity, ACL laxity or PCL laxity.Normal alignment, normal meniscus and normal patellar mobility. Normal pulse.     Comments: Tenderness to palpation throughout anterior knee.  No obvious swelling, discoloration, warmth, abrasions, laceration noted.  Neurovascular intact.  Full range of motion of knee present but with pain.  Neurological:     General: No  focal deficit present.     Mental Status: He is alert and oriented to person, place, and time. Mental status is at baseline.     Cranial Nerves: Cranial nerves 2-12 are intact.     Sensory: Sensation is intact.     Motor: Motor function is intact.     Coordination: Coordination is intact.     Gait: Gait is intact.  Psychiatric:        Mood and Affect: Mood normal.        Behavior: Behavior normal.        Thought Content: Thought content normal.        Judgment: Judgment normal.      UC Treatments / Results  Labs (all labs ordered are listed, but only abnormal results are displayed) Labs Reviewed - No data to display  EKG   Radiology No results found.  Procedures Procedures (including critical care time)  Medications Ordered in UC Medications - No data to display  Initial Impression / Assessment and Plan / UC Course  I have reviewed the triage vital signs and the nursing notes.  Pertinent labs & imaging results that were available during my care of the patient were reviewed by me and considered in my medical decision making (see chart for details).     Do not think imaging of the neck is necessary given no obvious injury and given that pain has been present in both knees.  Suspect inflammation versus osteoarthritis versus versus muscular strain/injury.  Will treat with NSAIDs.  Patient reports that he wishes to take ibuprofen that he has at home.  Discussed ice application, continuing knee brace, supportive care.  Patient to follow-up with orthopedist at provided contact information for further evaluation and management.  Patient does have slightly elevated blood pressure reading and heart rate but this is most likely attributed to pain.  Patient advised to monitor very closely at home.  No signs of endorgan damage or hypertensive urgency on exam.  Discussed return and ER precautions.  Patient verbalized understanding and was agreeable with plan. Final Clinical Impressions(s)  / UC Diagnoses   Final diagnoses:  Acute pain of left knee     Discharge Instructions  You have inflammation of your knee.  Please take ibuprofen as needed as we discussed.  You may supplement with Tylenol.  Continue right knee brace.  Recommend ice application and rest as well.  Follow-up with orthopedist at provided contact information if symptoms persist or worsen.   ED Prescriptions   None    PDMP not reviewed this encounter.   Gustavus Bryant, [place] 10/31/21 1034

## 2021-10-31 NOTE — ED Triage Notes (Signed)
Pt is present today with c/o left knee pain. Pt states that he noticed the pain last Tuesday. Pt states that since he has to shift his weight to his left knee because he has pain in the right knee it cause discomfort within the left knee. Pt denies any injury
